# Patient Record
Sex: Male | Born: 1940
Health system: Southern US, Community
[De-identification: ages and names within clinical notes are randomized; demographics above are authoritative.]

## PROBLEM LIST (undated history)

## (undated) ENCOUNTER — Ambulatory Visit: Discharge status: Absent without leave | Payer: BLUE CROSS/BLUE SHIELD

## (undated) DIAGNOSIS — R011 Cardiac murmur, unspecified: Secondary | ICD-10-CM

## (undated) DIAGNOSIS — T7840XA Allergy, unspecified, initial encounter: Secondary | ICD-10-CM

## (undated) DIAGNOSIS — I4719 Other supraventricular tachycardia: Secondary | ICD-10-CM

## (undated) DIAGNOSIS — H269 Unspecified cataract: Secondary | ICD-10-CM

## (undated) DIAGNOSIS — I471 Supraventricular tachycardia: Secondary | ICD-10-CM

## (undated) DIAGNOSIS — C61 Malignant neoplasm of prostate: Secondary | ICD-10-CM

## (undated) DIAGNOSIS — E785 Hyperlipidemia, unspecified: Secondary | ICD-10-CM

## (undated) DIAGNOSIS — I099 Rheumatic heart disease, unspecified: Secondary | ICD-10-CM

## (undated) DIAGNOSIS — K5790 Diverticulosis of intestine, part unspecified, without perforation or abscess without bleeding: Secondary | ICD-10-CM

## (undated) DIAGNOSIS — Z5189 Encounter for other specified aftercare: Secondary | ICD-10-CM

## (undated) HISTORY — DX: Cardiac murmur, unspecified: R01.1

## (undated) HISTORY — DX: Diverticulosis of intestine, part unspecified, without perforation or abscess without bleeding: K57.90

## (undated) HISTORY — DX: Allergy, unspecified, initial encounter: T78.40XA

## (undated) HISTORY — DX: Malignant neoplasm of prostate: C61

## (undated) HISTORY — DX: Rheumatic heart disease, unspecified: I09.9

## (undated) HISTORY — DX: Hyperlipidemia, unspecified: E78.5

## (undated) HISTORY — DX: Other supraventricular tachycardia: I47.19

## (undated) HISTORY — DX: Supraventricular tachycardia: I47.1

## (undated) HISTORY — DX: Unspecified cataract: H26.9

## (undated) HISTORY — DX: Encounter for other specified aftercare: Z51.89

---
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
## 2004-04-29 HISTORY — PX: WRIST FUSION: SHX839

## 2004-09-26 ENCOUNTER — Ambulatory Visit: Payer: Self-pay | Admitting: Internal Medicine

## 2004-10-17 ENCOUNTER — Ambulatory Visit: Payer: Self-pay | Admitting: Internal Medicine

## 2006-01-27 HISTORY — PX: INGUINAL HERNIA REPAIR: SHX194

## 2006-02-24 ENCOUNTER — Encounter: Admission: RE | Admit: 2006-02-24 | Discharge: 2006-02-24 | Payer: Self-pay | Admitting: General Surgery

## 2006-03-12 ENCOUNTER — Encounter: Admission: RE | Admit: 2006-03-12 | Discharge: 2006-03-12 | Payer: Self-pay | Admitting: General Surgery

## 2007-04-30 HISTORY — PX: CATARACT EXTRACTION W/ INTRAOCULAR LENS IMPLANT: SHX1309

## 2008-08-15 ENCOUNTER — Ambulatory Visit: Payer: Self-pay | Admitting: Internal Medicine

## 2008-08-15 DIAGNOSIS — I019 Acute rheumatic heart disease, unspecified: Secondary | ICD-10-CM

## 2008-08-15 DIAGNOSIS — E785 Hyperlipidemia, unspecified: Secondary | ICD-10-CM

## 2008-08-15 DIAGNOSIS — I119 Hypertensive heart disease without heart failure: Secondary | ICD-10-CM

## 2008-08-29 ENCOUNTER — Encounter: Payer: Self-pay | Admitting: Internal Medicine

## 2008-08-30 ENCOUNTER — Ambulatory Visit: Payer: Self-pay

## 2008-09-02 ENCOUNTER — Ambulatory Visit: Payer: Self-pay

## 2008-09-02 ENCOUNTER — Ambulatory Visit: Payer: Self-pay | Admitting: Internal Medicine

## 2008-09-02 DIAGNOSIS — C61 Malignant neoplasm of prostate: Secondary | ICD-10-CM

## 2008-09-14 ENCOUNTER — Encounter: Payer: Self-pay | Admitting: Internal Medicine

## 2008-09-16 ENCOUNTER — Encounter: Payer: Self-pay | Admitting: Internal Medicine

## 2008-10-03 ENCOUNTER — Ambulatory Visit: Payer: Self-pay | Admitting: Internal Medicine

## 2008-10-03 LAB — CONVERTED CEMR LAB
Cholesterol, target level: 200 mg/dL
LDL Goal: 130 mg/dL

## 2008-10-24 ENCOUNTER — Telehealth: Payer: Self-pay | Admitting: Internal Medicine

## 2008-10-26 ENCOUNTER — Encounter: Payer: Self-pay | Admitting: Internal Medicine

## 2008-11-11 ENCOUNTER — Ambulatory Visit: Admission: RE | Admit: 2008-11-11 | Discharge: 2009-02-09 | Payer: Self-pay | Admitting: Radiation Oncology

## 2008-11-14 ENCOUNTER — Encounter: Payer: Self-pay | Admitting: Internal Medicine

## 2008-12-02 ENCOUNTER — Encounter: Admission: RE | Admit: 2008-12-02 | Discharge: 2008-12-02 | Payer: Self-pay | Admitting: Urology

## 2008-12-02 ENCOUNTER — Encounter: Payer: Self-pay | Admitting: Internal Medicine

## 2008-12-27 ENCOUNTER — Ambulatory Visit: Payer: Self-pay | Admitting: Internal Medicine

## 2009-01-06 ENCOUNTER — Telehealth (INDEPENDENT_AMBULATORY_CARE_PROVIDER_SITE_OTHER): Payer: Self-pay | Admitting: *Deleted

## 2009-01-13 ENCOUNTER — Ambulatory Visit (HOSPITAL_BASED_OUTPATIENT_CLINIC_OR_DEPARTMENT_OTHER): Admission: RE | Admit: 2009-01-13 | Discharge: 2009-01-13 | Payer: Self-pay | Admitting: Urology

## 2009-01-17 ENCOUNTER — Encounter: Payer: Self-pay | Admitting: Internal Medicine

## 2009-01-18 ENCOUNTER — Ambulatory Visit: Payer: Self-pay | Admitting: Internal Medicine

## 2009-01-24 ENCOUNTER — Encounter: Payer: Self-pay | Admitting: Internal Medicine

## 2009-01-24 ENCOUNTER — Ambulatory Visit: Payer: Self-pay | Admitting: Internal Medicine

## 2009-01-25 ENCOUNTER — Telehealth: Payer: Self-pay | Admitting: Internal Medicine

## 2009-01-25 ENCOUNTER — Telehealth (INDEPENDENT_AMBULATORY_CARE_PROVIDER_SITE_OTHER): Payer: Self-pay | Admitting: *Deleted

## 2009-02-01 ENCOUNTER — Ambulatory Visit: Payer: Self-pay | Admitting: Internal Medicine

## 2009-02-03 ENCOUNTER — Encounter: Payer: Self-pay | Admitting: Internal Medicine

## 2009-02-13 ENCOUNTER — Ambulatory Visit: Admission: RE | Admit: 2009-02-13 | Discharge: 2009-02-27 | Payer: Self-pay | Admitting: Radiation Oncology

## 2009-02-24 ENCOUNTER — Telehealth: Payer: Self-pay | Admitting: Internal Medicine

## 2009-02-27 ENCOUNTER — Ambulatory Visit: Payer: Self-pay | Admitting: Internal Medicine

## 2009-02-28 ENCOUNTER — Encounter: Payer: Self-pay | Admitting: Internal Medicine

## 2009-02-28 ENCOUNTER — Telehealth (INDEPENDENT_AMBULATORY_CARE_PROVIDER_SITE_OTHER): Payer: Self-pay | Admitting: *Deleted

## 2009-03-01 ENCOUNTER — Encounter (HOSPITAL_COMMUNITY): Admission: RE | Admit: 2009-03-01 | Discharge: 2009-04-25 | Payer: Self-pay | Admitting: Internal Medicine

## 2009-03-01 ENCOUNTER — Ambulatory Visit: Payer: Self-pay

## 2009-03-01 ENCOUNTER — Ambulatory Visit: Payer: Self-pay | Admitting: Cardiovascular Disease

## 2009-03-09 ENCOUNTER — Encounter: Payer: Self-pay | Admitting: Internal Medicine

## 2009-03-16 ENCOUNTER — Telehealth (INDEPENDENT_AMBULATORY_CARE_PROVIDER_SITE_OTHER): Payer: Self-pay | Admitting: *Deleted

## 2009-04-25 ENCOUNTER — Telehealth (INDEPENDENT_AMBULATORY_CARE_PROVIDER_SITE_OTHER): Payer: Self-pay | Admitting: *Deleted

## 2009-04-26 ENCOUNTER — Encounter: Payer: Self-pay | Admitting: Internal Medicine

## 2009-04-27 ENCOUNTER — Ambulatory Visit: Payer: Self-pay | Admitting: Internal Medicine

## 2009-04-29 HISTORY — PX: CARDIAC ELECTROPHYSIOLOGY STUDY AND ABLATION: SHX1294

## 2009-04-29 HISTORY — PX: TOTAL KNEE ARTHROPLASTY: SHX125

## 2009-04-29 HISTORY — PX: MELANOMA EXCISION: SHX5266

## 2009-05-01 ENCOUNTER — Ambulatory Visit: Payer: Self-pay | Admitting: Cardiovascular Disease

## 2009-05-01 ENCOUNTER — Inpatient Hospital Stay (HOSPITAL_COMMUNITY): Admission: AD | Admit: 2009-05-01 | Discharge: 2009-05-04 | Payer: Self-pay | Admitting: Orthopedic Surgery

## 2009-06-05 ENCOUNTER — Encounter: Payer: Self-pay | Admitting: Internal Medicine

## 2009-06-07 ENCOUNTER — Telehealth: Payer: Self-pay | Admitting: Internal Medicine

## 2009-06-13 ENCOUNTER — Telehealth (INDEPENDENT_AMBULATORY_CARE_PROVIDER_SITE_OTHER): Payer: Self-pay | Admitting: *Deleted

## 2009-07-19 ENCOUNTER — Encounter: Payer: Self-pay | Admitting: Internal Medicine

## 2009-07-27 ENCOUNTER — Ambulatory Visit: Payer: Self-pay | Admitting: Internal Medicine

## 2009-08-10 ENCOUNTER — Telehealth: Payer: Self-pay | Admitting: Internal Medicine

## 2009-08-23 ENCOUNTER — Ambulatory Visit: Payer: Self-pay | Admitting: Internal Medicine

## 2009-08-24 ENCOUNTER — Encounter: Payer: Self-pay | Admitting: Internal Medicine

## 2009-09-06 ENCOUNTER — Ambulatory Visit: Payer: Self-pay | Admitting: Internal Medicine

## 2009-09-06 LAB — CONVERTED CEMR LAB
BUN: 12 mg/dL (ref 6–23)
Basophils Absolute: 0 10*3/uL (ref 0.0–0.1)
Calcium: 9.6 mg/dL (ref 8.4–10.5)
Creatinine, Ser: 1 mg/dL (ref 0.4–1.5)
Eosinophils Relative: 1.7 % (ref 0.0–5.0)
GFR calc non Af Amer: 82.8 mL/min (ref >60)
Glucose, Bld: 78 mg/dL (ref 70–99)
HCT: 41.4 % (ref 39.0–52.0)
INR: 1 (ref 0.8–1.0)
Lymphocytes Relative: 36.8 % (ref 12.0–46.0)
Monocytes Relative: 14.4 % — ABNORMAL HIGH (ref 3.0–12.0)
Neutrophils Relative %: 46.4 % (ref 43.0–77.0)
Platelets: 209 10*3/uL (ref 150.0–400.0)
Potassium: 4.6 meq/L (ref 3.5–5.1)
Prothrombin Time: 10.6 s (ref 9.1–11.7)
RDW: 13.9 % (ref 11.5–14.6)
WBC: 4.8 10*3/uL (ref 4.5–10.5)
aPTT: 26.1 s (ref 21.7–28.8)

## 2009-09-13 ENCOUNTER — Ambulatory Visit (HOSPITAL_COMMUNITY): Admission: RE | Admit: 2009-09-13 | Discharge: 2009-09-14 | Payer: Self-pay | Admitting: Internal Medicine

## 2009-09-13 ENCOUNTER — Ambulatory Visit: Payer: Self-pay | Admitting: Internal Medicine

## 2009-10-11 ENCOUNTER — Ambulatory Visit: Payer: Self-pay | Admitting: Internal Medicine

## 2009-10-11 LAB — CONVERTED CEMR LAB
Cholesterol: 205 mg/dL — ABNORMAL HIGH (ref 0–200)
Direct LDL: 128.8 mg/dL
Nitrite: NEGATIVE
Specific Gravity, Urine: 1.025 (ref 1.000–1.030)
Total CHOL/HDL Ratio: 5
Total Protein, Urine: NEGATIVE mg/dL
Triglycerides: 149 mg/dL (ref 0.0–149.0)
Urine Glucose: NEGATIVE mg/dL
Urobilinogen, UA: 0.2 (ref 0.0–1.0)

## 2009-10-23 ENCOUNTER — Encounter: Payer: Self-pay | Admitting: Internal Medicine

## 2009-12-28 ENCOUNTER — Ambulatory Visit: Payer: Self-pay | Admitting: Internal Medicine

## 2010-01-09 ENCOUNTER — Telehealth: Payer: Self-pay | Admitting: Internal Medicine

## 2010-01-10 ENCOUNTER — Telehealth: Payer: Self-pay | Admitting: Internal Medicine

## 2010-02-05 ENCOUNTER — Ambulatory Visit: Payer: Self-pay | Admitting: Internal Medicine

## 2010-02-05 ENCOUNTER — Telehealth: Payer: Self-pay | Admitting: Internal Medicine

## 2010-02-05 DIAGNOSIS — L570 Actinic keratosis: Secondary | ICD-10-CM | POA: Insufficient documentation

## 2010-04-09 ENCOUNTER — Encounter: Payer: Self-pay | Admitting: Internal Medicine

## 2010-05-27 LAB — CONVERTED CEMR LAB
ALT: 23 units/L (ref 0–53)
Albumin: 4 g/dL (ref 3.5–5.2)
Alkaline Phosphatase: 70 units/L (ref 39–117)
Basophils Relative: 0.4 % (ref 0.0–3.0)
CO2: 29 meq/L (ref 19–32)
Chloride: 109 meq/L (ref 96–112)
Eosinophils Absolute: 0 10*3/uL (ref 0.0–0.7)
Eosinophils Relative: 0.9 % (ref 0.0–5.0)
Hemoglobin: 14.1 g/dL (ref 13.0–17.0)
Ketones, ur: NEGATIVE mg/dL
MCHC: 35.2 g/dL (ref 30.0–36.0)
MCV: 97.7 fL (ref 78.0–100.0)
Monocytes Absolute: 0.5 10*3/uL (ref 0.1–1.0)
Neutro Abs: 2.7 10*3/uL (ref 1.4–7.7)
Nitrite: NEGATIVE
PSA: 2.99 ng/mL (ref 0.10–4.00)
Potassium: 4.2 meq/L (ref 3.5–5.1)
RBC: 4.11 M/uL — ABNORMAL LOW (ref 4.22–5.81)
Sodium: 144 meq/L (ref 135–145)
Total CHOL/HDL Ratio: 5
Total Protein, Urine: NEGATIVE mg/dL
Total Protein: 7.2 g/dL (ref 6.0–8.3)
Triglycerides: 111 mg/dL (ref 0.0–149.0)

## 2010-05-29 NOTE — Letter (Signed)
[facility]   Imported By: Sherian Rein 10/26/2009 12:16:21  _____________________________________________________________________  External Attachment:    Type:   Image     Comment:   External Document

## 2010-05-29 NOTE — Assessment & Plan Note (Signed)
Summary: eph./ appt is 4:30/ gd   Visit Type:  Follow-up Referring Provider:  Dr Graciela Husbands Primary Provider:  Etta Grandchild MD   History of Present Illness: Patrick [name] is seen in followup for atrial arrhythmias. In late May he underwent catheter ablation in conjunction with Dr. Johney Frame who mapped and ablated a left peri-mitral annular tachycardia rendering him noninducible.  He went from having multiple episodes daily 2 having non-at all until yesterday when while playing golf he had 30 minutes of a heart rate in the 150 range. He has had none since. This is despite exertion which has been in the past in normal trigger.  Current Medications (verified): 1)  Aspirin Ec 325 Mg Tbec (Aspirin) .... Take One Tablet By Mouth Daily 2)  Multivitamins   Tabs (Multiple Vitamin) .Marland Kitchen.. 1 Tab By Mouth Once Daily 3)  Vitamin C 500 Mg Tabs (Ascorbic Acid) .Marland Kitchen.. 1 Tab By Mouth Once Daily 4)  Vitamin E 400 Unit Caps (Vitamin E) .Marland Kitchen.. 1 Tab By Mouth Once Daily 5)  Toprol Xl 50 Mg Xr24h-Tab (Metoprolol Succinate) .... Take One Tablet Daily 6)  Lotensin 10 Mg Tabs (Benazepril Hcl) .... As Needed 7)  Rapaflo 8 Mg Caps (Silodosin) .... As Needed  Allergies (verified): 1)  ! Penicillin V Potassium (Penicillin V Potassium)  Past History:  Past Medical History: Last updated: 08/23/2009 Hyperlipidemia Atrial tachycardia Prostate cancer Gleason 6 status post radiation seed implant September 2010 Rheumatic heart dz. at 7 yoa  Vital Signs:  Patient profile:   70 year old male Height:      75 inches Weight:      207 pounds Pulse rate:   70 / minute BP sitting:   122 / 78  (left arm)  Vitals Entered By: Laurance Flatten CMA (October 11, 2009 4:40 PM)  Physical Exam  General:  The patient was alert and oriented in no acute distress. HEENT Normal.  Neck veins were flat, carotids were brisk.  Lungs were clear.  Heart sounds were regular without murmurs or gallops.  Abdomen was soft with active bowel sounds. There is  no clubbing cyanosis or edema. Skin Warm and dry    EKG  Procedure date:  10/11/2009  Findings:      sinus rhythm at 70 Intervals 0.17/2106.38 Axis is -24 PVCs identified with a left bundle inferior axis  Impression & Recommendations:  Problem # 1:  PREMATURE VENTRICULAR CONTRACTIONS (ICD-427.69) PVCs are newly identified on electrocardiogram. They are asymptomatic. We will continue to observe them at this juncture without further evaluation His updated medication list for this problem includes:    Aspirin Ec 325 Mg Tbec (Aspirin) .Marland Kitchen... Take one tablet by mouth daily    Toprol Xl 50 Mg Xr24h-tab (Metoprolol succinate) .Marland Kitchen... Take one tablet daily    Lotensin 10 Mg Tabs (Benazepril hcl) .Marland Kitchen... As needed  Problem # 2:  LEFT ATRIAL TACHYCARDIA S/P RFCA (ICD-427.0) he is 99.9% better; obviously the event yesterday is disconcerting. We will continue to observe and let the frequency of recurrent episodes determine our course of action His updated medication list for this problem includes:    Aspirin Ec 325 Mg Tbec (Aspirin) .Marland Kitchen... Take one tablet by mouth daily    Toprol Xl 50 Mg Xr24h-tab (Metoprolol succinate) .Marland Kitchen... Take one tablet daily    Lotensin 10 Mg Tabs (Benazepril hcl) .Marland Kitchen... As needed  Problem # 3:  HYPERTENSION, HEART CONTROLLED W/O ASSOC CHF (ICD-402.10) his blood pressure has been well controlled on his current medications.  He has been taking his Lotensin only every few days. We will plan to have him stop it at this juncture and continue him on metoprolol.  Ultimately however beta blockers are not very good antihypertensives and some alternative therapy will be necessary if blood pressure remains  a necessary target of therapy His updated medication list for this problem includes:    Aspirin Ec 325 Mg Tbec (Aspirin) .Marland Kitchen... Take one tablet by mouth daily    Toprol Xl 50 Mg Xr24h-tab (Metoprolol succinate) .Marland Kitchen... Take one tablet daily    Lotensin 10 Mg Tabs (Benazepril hcl)  .Marland Kitchen... As needed  Patient Instructions: 1)  Your physician wants you to follow-up in:3 months with Dr Graciela Husbands.  You will receive a reminder letter in the mail two months in advance. If you don't receive a letter, please call our office to schedule the follow-up appointment.

## 2010-05-29 NOTE — Letter (Signed)
Summary: ELectrophysiology/Ablation Procedure Instructions  [facility], Main Office  [address]   [place], [place] [address]   Phone: [phone]  Fax: [phone]     Electrophysiology/Ablation Procedure Instructions    You are scheduled for a(n) ablation on 09/13/2009 at 7:30am with Dr. Johney Frame and Dr Graciela Husbands  1.  Please come to the Short Stay Center at [facility] at 5:30am on the day of your procedure.  2.  Come prepared to stay overnight.   Please bring your insurance cards and a list of your medications.  3.  Come to the [employer] office on 09/06/09 for lab work at [employer] do not have to be fasting.  4.  Do not have anything to eat or drink after midnight the night before your procedure.  5.  Do NOT take these medications for  the day prior and the day of your procedure unless otherwise instructed:  Toprol XL.  All of your remaining medications may be taken with a small amount of water.  6.  Educational material received:   _____ Ablation   * Occasionally, EP studies and ablations can become lengthy.  Please make your family aware of this before your procedure starts.  Average time ranges from 2-8 hours for EP studies/ablations.  Your physician will locate your family after the procedure with the results.  * If you have any questions after you get home, please call the office at [phone].  Anselm Pancoast

## 2010-05-29 NOTE — Letter (Signed)
Summary: Lipid Letter  [facility]  [address] [place], [place] [address]   Phone: [phone]  Fax: [phone]    10/11/2009  Quay Simkin [address] [place], [place]  [address]  Dear Peyton Najjar:  We have carefully reviewed your last lipid profile from 08/15/2008 and the results are noted below with a summary of recommendations for lipid management.    Cholesterol:       205     Goal: <200   HDL "good" Cholesterol:   57.84     Goal: >40   LDL "bad" Cholesterol:   129     Goal: <130   Triglycerides:       149.0     Goal: <150        TLC Diet (Therapeutic Lifestyle Change): Saturated Fats & Transfatty acids should be kept < 7% of total calories ***Reduce Saturated Fats Polyunstaurated Fat can be up to 10% of total calories Monounsaturated Fat Fat can be up to 20% of total calories Total Fat should be no greater than 25-35% of total calories Carbohydrates should be 50-60% of total calories Protein should be approximately 15% of total calories Fiber should be at least 20-30 grams a day ***Increased fiber may help lower LDL Total Cholesterol should be < 200mg /day Consider adding plant stanol/sterols to diet (example: Benacol spread) ***A higher intake of unsaturated fat may reduce Triglycerides and Increase HDL    Adjunctive Measures (may lower LIPIDS and reduce risk of Heart Attack) include: Aerobic Exercise (20-30 minutes 3-4 times a week) Limit Alcohol Consumption Weight Reduction Aspirin 75-81 mg a day by mouth (if not allergic or contraindicated) Dietary Fiber 20-30 grams a day by mouth     Current Medications: 1)    Aspirin Ec 325 Mg Tbec (Aspirin) .... Take one tablet by mouth daily 2)    Multivitamins   Tabs (Multiple vitamin) .Marland Kitchen.. 1 tab by mouth once daily 3)    Vitamin C 500 Mg Tabs (Ascorbic acid) .Marland Kitchen.. 1 tab by mouth once daily-hold 4)    Vitamin E 400 Unit Caps (Vitamin e) .Marland Kitchen.. 1 tab by mouth once daily-hold 5)    Toprol Xl 50 Mg Xr24h-tab  (Metoprolol succinate) .... Take one tablet daily 6)    Lotensin 10 Mg Tabs (Benazepril hcl) .... Take one tablet daily 7)    Rapaflo 8 Mg Caps (Silodosin) .... Take one capsule daily  If you have any questions, please call. We appreciate being able to work with you.   Sincerely,    [facility] Etta Grandchild MD

## 2010-05-29 NOTE — Letter (Signed)
Summary: Results Follow-up Letter  [facility]  [address] [place], [place] [address]   Phone: [phone]  Fax: [phone]    10/11/2009  [address] [place], [place]  [address]  Dear Patrick [name],   The following are the results of your recent test(s):  Test     Result     Prostate= 0.56   great Thyroid     normal Urine       normal   _________________________________________________________  Please call for an appointment as directed _________________________________________________________ _________________________________________________________ _________________________________________________________  Sincerely,  Sanda Linger MD [facility]

## 2010-05-29 NOTE — Progress Notes (Signed)
    Colorectal Screening:  Current Recommendations:    Colonoscopy recommended: patient defers today but will consider in the future  Colonoscopy Results:    Date of Exam: 10/17/2004    Results: Normal  PSA Screening:    PSA: 0.56  (10/11/2009)  Immunization & Chemoprophylaxis:    Tetanus vaccine: Td  (05/15/2006)    Influenza vaccine: Fluvax 3+  (02/05/2010)

## 2010-05-29 NOTE — Letter (Signed)
Summary: [facility]   Imported By: Lester Slabtown 07/26/2009 11:44:19  _____________________________________________________________________  External Attachment:    Type:   Image     Comment:   External Document

## 2010-05-29 NOTE — Letter (Signed)
Summary: [facility]   Imported By: Lester Quay 06/08/2009 09:44:41  _____________________________________________________________________  External Attachment:    Type:   Image     Comment:   External Document

## 2010-05-29 NOTE — Assessment & Plan Note (Signed)
Summary: 4 MO ROV /NWS   Vital Signs:  Patient profile:   70 year old male Height:      75 inches Weight:      212 pounds BMI:     26.59 O2 Sat:      96 % on Room air Temp:     97.1 degrees F oral Pulse rate:   76 / minute Pulse rhythm:   regular BP sitting:   118 / 70  (left arm) Cuff size:   large  Vitals Entered By: Rock Nephew CMA (February 05, 2010 8:28 AM)  O2 Flow:  Room air  Procedure Note Last Tetanus: Td (05/15/2006)  Cyst Removal: The patient complains of suspicious lesion. Date of onset: 12/12/2009 Onset of lesion: 2 months Indication: suspicious lesion    Size (in cm): 0.4 x 0.4    Location: left frontal scalp    Comment: the lesion is scaly and slightly raised with sharp edges, there is no pearly appearance and no pigmentation  Additional Instructions: cryotherapy was applied for 10 seconds X3, he tolerated it well  CC: follow-up visit Is Patient Diabetic? No Pain Assessment Patient in pain? no       Does patient need assistance? Functional Status Self care Ambulation Normal   Primary Care Provider:  Etta Grandchild MD  CC:  follow-up visit.  History of Present Illness:  Follow-Up Visit      This is a 70 year old man who presents for Follow-up visit.  The patient denies chest pain, palpitations, dizziness, syncope, edema, SOB, DOE, and PND.  Since the last visit the patient notes no new problems or concerns and being seen by a specialist.  The patient reports taking meds as prescribed, monitoring BP, and monitoring blood sugars.  When questioned about possible medication side effects, the patient notes none.    Preventive Screening-Counseling & Management  Alcohol-Tobacco     Alcohol drinks/day: <1     Smoking Status: never  Clinical Review Panels:  Prevention   Last Colonoscopy:  Normal (10/17/2004)   Last PSA:  0.56 (10/11/2009)  Immunizations   Last Tetanus Booster:  Td (05/15/2006)  Lipid Management   Cholesterol:  205  (10/11/2009)   LDL (bad choesterol):  125 (08/15/2008)   HDL (good cholesterol):  41.70 (10/11/2009)  Diabetes Management   Creatinine:  1.0 (09/06/2009)  CBC   WBC:  4.8 (09/06/2009)   RBC:  4.32 (09/06/2009)   Hgb:  14.4 (09/06/2009)   Hct:  41.4 (09/06/2009)   Platelets:  209.0 (09/06/2009)   MCV  95.9 (09/06/2009)   MCHC  34.8 (09/06/2009)   RDW  13.9 (09/06/2009)   PMN:  46.4 (09/06/2009)   Lymphs:  36.8 (09/06/2009)   Monos:  14.4 (09/06/2009)   Eosinophils:  1.7 (09/06/2009)   Basophil:  0.7 (09/06/2009)  Complete Metabolic Panel   Glucose:  78 (09/06/2009)   Sodium:  143 (09/06/2009)   Potassium:  4.6 (09/06/2009)   Chloride:  105 (09/06/2009)   CO2:  30 (09/06/2009)   BUN:  12 (09/06/2009)   Creatinine:  1.0 (09/06/2009)   Albumin:  4.0 (08/15/2008)   Total Protein:  7.2 (08/15/2008)   Calcium:  9.6 (09/06/2009)   Total Bili:  1.2 (08/15/2008)   Alk Phos:  70 (08/15/2008)   SGPT (ALT):  23 (08/15/2008)   SGOT (AST):  31 (08/15/2008)   Medications Prior to Update: 1)  Aspirin Ec 325 Mg Tbec (Aspirin) .... Take One Tablet By Mouth Daily 2)  Multivitamins  Tabs (Multiple Vitamin) .Marland Kitchen.. 1 Tab By Mouth Once Daily 3)  Vitamin C 500 Mg Tabs (Ascorbic Acid) .Marland Kitchen.. 1 Tab By Mouth Once Daily 4)  Vitamin E 400 Unit Caps (Vitamin E) .Marland Kitchen.. 1 Tab By Mouth Once Daily 5)  Metoprolol Succinate 25 Mg Xr24h-Tab (Metoprolol Succinate) .... Take One Tablet By Mouth Daily 6)  Rapaflo 8 Mg Caps (Silodosin) .... As Needed  Current Medications (verified): 1)  Aspirin Ec 325 Mg Tbec (Aspirin) .... Take One Tablet By Mouth Daily 2)  Multivitamins   Tabs (Multiple Vitamin) .Marland Kitchen.. 1 Tab By Mouth Once Daily 3)  Vitamin C 500 Mg Tabs (Ascorbic Acid) .Marland Kitchen.. 1 Tab By Mouth Once Daily 4)  Vitamin E 400 Unit Caps (Vitamin E) .Marland Kitchen.. 1 Tab By Mouth Once Daily 5)  Metoprolol Succinate 25 Mg Xr24h-Tab (Metoprolol Succinate) .... Take One Tablet By Mouth Daily  Allergies (verified): 1)  ! Penicillin V  Potassium (Penicillin V Potassium)  Past History:  Past Medical History: Last updated: 08/23/2009 Hyperlipidemia Atrial tachycardia Prostate cancer Gleason 6 status post radiation seed implant September 2010 Rheumatic heart dz. at 64 yoa  Past Surgical History: Last updated: 08/23/2009 Inguinal herniorrhaphy s/p TKA 1/11  Family History: Last updated: 08/15/2008 Family History of Arthritis Family History Breast cancer 1st degree relative <50 Family History Diabetes 1st degree relative  Social History: Last updated: 08/23/2009 Retired from [employer] and lives in [place], [place]. Married Never Smoked Alcohol use-no Drug use-no Regular exercise-yes  Risk Factors: Alcohol Use: <1 (02/05/2010) Exercise: yes (08/15/2008)  Risk Factors: Smoking Status: never (02/05/2010)  Family History: Reviewed history from 08/15/2008 and no changes required. Family History of Arthritis Family History Breast cancer 1st degree relative <50 Family History Diabetes 1st degree relative  Social History: Reviewed history from 08/23/2009 and no changes required. Retired from [employer] and lives in [place], [place]. Married Never Smoked Alcohol use-no Drug use-no Regular exercise-yes  Review of Systems  The patient denies anorexia, fever, weight loss, weight gain, chest pain, syncope, peripheral edema, prolonged cough, headaches, hemoptysis, abdominal pain, melena, hematochezia, severe indigestion/heartburn, hematuria, incontinence, difficulty walking, depression, enlarged lymph nodes, and angioedema.   GU:  Denies discharge, dysuria, erectile dysfunction, hematuria, nocturia, urinary frequency, and urinary hesitancy.  Physical Exam  General:  alert and well-developed.   Mouth:  no gingival abnormalities and pharynx pink and moist.   Neck:  No deformities, masses, or tenderness noted. Lungs:  Normal respiratory effort, chest expands symmetrically. Lungs are clear to  auscultation, no crackles or wheezes. Heart:  normal rate, regular rhythm, no murmur, no gallop, no rub, and no JVD.   Abdomen:  soft, non-tender, normal bowel sounds, no distention, no masses, no guarding, no rigidity, no rebound tenderness, no abdominal hernia, no hepatomegaly, and no splenomegaly.   Msk:  normal ROM, no joint tenderness, no joint swelling, no joint warmth, no redness over joints, no joint deformities, no joint instability, no crepitation, and no muscle atrophy.   Pulses:  R and L carotid,radial,femoral,dorsalis pedis and posterior tibial pulses are full and equal bilaterally Extremities:  no edema, no erythema  Neurologic:  alert & oriented X3, cranial nerves II-XII intact, and strength normal in all extremities.   Skin:  turgor normal, color normal, no rashes, no suspicious lesions, no ecchymoses, no petechiae, no purpura, no ulcerations, no edema, and actinic keratosis.   Cervical Nodes:  no anterior cervical adenopathy and no posterior cervical adenopathy.   Axillary Nodes:  no R axillary adenopathy and no L axillary  adenopathy.   Inguinal Nodes:  no R inguinal adenopathy and no L inguinal adenopathy.   Psych:  Cognition and judgment appear intact. Alert and cooperative with normal attention span and concentration. No apparent delusions, illusions, hallucinations   Impression & Recommendations:  Problem # 1:  ACTINIC KERATOSIS (ICD-702.0)  Orders: Cryotherapy/Destruction benign or premalignant lesion (1st lesion)  ([address])  Problem # 2:  LEFT ATRIAL TACHYCARDIA S/P RFCA (ICD-427.0) Assessment: Improved  His updated medication list for this problem includes:    Aspirin Ec 325 Mg Tbec (Aspirin) .Marland Kitchen... Take one tablet by mouth daily    Metoprolol Succinate 25 Mg Xr24h-tab (Metoprolol succinate) .Marland Kitchen... Take one tablet by mouth daily  Problem # 3:  ADENOCARCINOMA, PROSTATE, GLEASON GRADE 6 (ICD-185) Assessment: Improved  Problem # 4:  HYPERTENSION, HEART CONTROLLED W/O  ASSOC CHF (ICD-402.10) Assessment: Improved  His updated medication list for this problem includes:    Metoprolol Succinate 25 Mg Xr24h-tab (Metoprolol succinate) .Marland Kitchen... Take one tablet by mouth daily  Problem # 5:  HYPERLIPIDEMIA (ICD-272.4) Assessment: Unchanged he does not want to take a statin Labs Reviewed: SGOT: 31 (08/15/2008)   SGPT: 23 (08/15/2008)  Lipid Goals: Chol Goal: 200 (10/03/2008)   HDL Goal: 40 (10/03/2008)   LDL Goal: 130 (10/03/2008)   TG Goal: 150 (10/03/2008)  Prior 10 Yr Risk Heart Disease: 14 % (10/03/2008)   HDL:41.70 (10/11/2009), 38.30 (08/15/2008)  LDL:125 (08/15/2008)  Chol:205 (10/11/2009), 185 (08/15/2008)  Trig:149.0 (10/11/2009), 111.0 (08/15/2008)  Complete Medication List: 1)  Aspirin Ec 325 Mg Tbec (Aspirin) .... Take one tablet by mouth daily 2)  Multivitamins Tabs (Multiple vitamin) .Marland Kitchen.. 1 tab by mouth once daily 3)  Vitamin C 500 Mg Tabs (Ascorbic acid) .Marland Kitchen.. 1 tab by mouth once daily 4)  Vitamin E 400 Unit Caps (Vitamin e) .Marland Kitchen.. 1 tab by mouth once daily 5)  Metoprolol Succinate 25 Mg Xr24h-tab (Metoprolol succinate) .... Take one tablet by mouth daily  Patient Instructions: 1)  Please schedule a follow-up appointment in 4 months. 2)  It is important that you exercise regularly at least 20 minutes 5 times a week. If you develop chest pain, have severe difficulty breathing, or feel very tired , stop exercising immediately and seek medical attention. 3)  You need to lose weight. Consider a lower calorie diet and regular exercise.    Not Administered:    Pneumonia Vaccine not given due to: declined    Influenza Vaccine not given due to: declined    Appended Document: 4 MO ROV /NWS    Clinical Lists Changes  Orders: Added new Service order of Flu Vaccine 82yrs + MEDICARE PATIENTS ([id number]) - Signed Added new Service order of Administration Flu vaccine - MCR ([id number]) - Signed Observations: Added new observation of FLU VAX VIS: 11/21/2009  version (02/05/2010 9:52) Added new observation of FLU VAXLOT: [id number] (02/05/2010 9:52) Added new observation of FLU VAXMFR: [employer] (02/05/2010 9:52) Added new observation of FLU VAX EXP: 10/27/2010 (02/05/2010 9:52) Added new observation of FLU VAX DSE: 0.64ml (02/05/2010 9:52) Added new observation of FLU VAX: Fluvax 3+ (02/05/2010 9:52)Flu Vaccine Consent Questions     Do you have a history of severe allergic reactions to this vaccine? no    Any prior history of allergic reactions to egg and/or gelatin? no    Do you have a sensitivity to the preservative Thimersol? no    Do you have a past history of Guillan-Barre Syndrome? no    Do you currently have an acute febrile illness? no  Have you ever had a severe reaction to latex? no    Vaccine information given and explained to patient? yes    Are you currently pregnant? no    Lot Number:[id number]   Exp Date:10/27/2010   Site Given  Left Deltoid IMservation of FLU VAX DSE: 0.44ml (02/05/2010 9:52) Added new observation of FLU VAX: Fluvax 3+ (02/05/2010 9:52)     [email or web address] Patient decided to have vaccine/la

## 2010-05-29 NOTE — Progress Notes (Signed)
Summary: pt wants to talk about new med  Phone Note Call from Patient Call back at Home Phone [phone]   Caller: Patient Reason for Call: Talk to Nurse, Talk to Doctor Summary of Call: pt was given another med and he wants to talk to Dr. Graciela Husbands before he starts taking it the name is rapaflo 8mg  once daily  Initial call taken by: Omer Jack,  June 13, 2009 1:48 PM  Follow-up for Phone Call        talked with wife --pt given RX for Rapaflo last week by urologist  for urinary retention--was told one side effect could be rapid heart rate--pt states since he has a history of SVT, if this will be OK for him to take--will forward to Dr Graciela Husbands for review Luana Shu   Additional Follow-up for Phone Call Additional follow up Details #1::        discussed with dr Graciela Husbands, okay given for pt to take rapaflo. pt aware Deliah Goody, RN  June 13, 2009 6:24 PM

## 2010-05-29 NOTE — Progress Notes (Signed)
Summary: QUESTION ABOUT NEW MEDICATION  Phone Note Call from Patient Call back at Home Phone [phone]   Caller: Patient Summary of Call: PT CALLING REGARDING SOME NEW MEDICATION. Initial call taken by: Judie Grieve,  June 07, 2009 2:17 PM  Follow-up for Phone Call        The pt had colon CA and had seeds placed recently. Since then he has had issues with urinary retention. His doctor gave him a prescription for Rapaflo and one of the side effects (according to the pt) is tachycardia. He sees Dr. Graciela Husbands for SVT and wants to know if this drug is ok for him to take. I s/w Dr. Jens Som (DOD) and he said the side effects appear to be orthostatic hypotention and not SVT. So, the pt should be ok and to just ask the pt to stop it if he has any issues. Pt had already addressed this with the prescribing MD and he thinks this is the least of the evils. Pt aware and will call back after Tues, when Dr. Graciela Husbands is back in the office if he needs to.  Follow-up by: Duncan Dull, RN, BSN,  June 07, 2009 2:41 PM

## 2010-05-29 NOTE — Assessment & Plan Note (Signed)
Summary: 3 month rov.sl   Referring Provider:  Dr Graciela Husbands Primary Provider:  Etta Grandchild MD  CC:  3 month rov.  Pt states he is feeling great.  Patrick [name]  History of Present Illness: Mr Chaput is seen in followup for atrial arrhythmias. In late May he underwent catheter ablation in conjunction with Dr. Johney Frame who mapped and ablated a left peri-mitral annular tachycardia rendering him noninducible.  He went from having multiple episodes daily 2 having etially none. He feels terrific  Current Medications (verified): 1)  Aspirin Ec 325 Mg Tbec (Aspirin) .... Take One Tablet By Mouth Daily 2)  Multivitamins   Tabs (Multiple Vitamin) .Patrick [name].. 1 Tab By Mouth Once Daily 3)  Vitamin C 500 Mg Tabs (Ascorbic Acid) .Patrick [name].. 1 Tab By Mouth Once Daily 4)  Vitamin E 400 Unit Caps (Vitamin E) .Patrick [name].. 1 Tab By Mouth Once Daily 5)  Toprol Xl 50 Mg Xr24h-Tab (Metoprolol Succinate) .... Take One Tablet Daily 6)  Rapaflo 8 Mg Caps (Silodosin) .... As Needed  Allergies (verified): 1)  ! Penicillin V Potassium (Penicillin V Potassium)  Past History:  Past Medical History: Last updated: 08/23/2009 Hyperlipidemia Atrial tachycardia Prostate cancer Gleason 6 status post radiation seed implant September 2010 Rheumatic heart dz. at 75 yoa  Past Surgical History: Last updated: 08/23/2009 Inguinal herniorrhaphy s/p TKA 1/11  Family History: Last updated: 08/15/2008 Family History of Arthritis Family History Breast cancer 1st degree relative <50 Family History Diabetes 1st degree relative  Social History: Last updated: 08/23/2009 Retired from [employer] and lives in [place], [place]. Married Never Smoked Alcohol use-no Drug use-no Regular exercise-yes  Vital Signs:  Patient profile:   70 year old male Height:      75 inches Weight:      212 pounds BMI:     26.59 Pulse rate:   65 / minute Pulse rhythm:   irregular BP sitting:   138 / 80  (left arm) Cuff size:   regular  Vitals Entered By: Judithe Modest CMA (December 28, 2009 9:39 AM)  Physical Exam  General:  The patient was alert and oriented in no acute distress. HEENT Normal.  Neck veins were flat, carotids were brisk.  Lungs were clear.  Heart sounds were regular without murmurs or gallops.  Abdomen was soft with active bowel sounds. There is no clubbing cyanosis or edema. Skin Warm and dry.pex      Impression & Recommendations:  Problem # 1:  PREMATURE VENTRICULAR CONTRACTIONS (ICD-427.69)  stable  The following medications were removed from the medication list:    Lotensin 10 Mg Tabs (Benazepril hcl) .Patrick [name]... As needed His updated medication list for this problem includes:    Aspirin Ec 325 Mg Tbec (Aspirin) .Patrick [name]... Take one tablet by mouth daily    Toprol Xl 50 Mg Xr24h-tab (Metoprolol succinate) .Patrick [name]... Take one tablet daily  Problem # 2:  LEFT ATRIAL TACHYCARDIA S/P RFCA (ICD-427.0) no significant recurrences The following medications were removed from the medication list:    Lotensin 10 Mg Tabs (Benazepril hcl) .Patrick [name]... As needed His updated medication list for this problem includes:    Aspirin Ec 325 Mg Tbec (Aspirin) .Patrick [name]... Take one tablet by mouth daily    Toprol Xl 50 Mg Xr24h-tab (Metoprolol succinate) .Patrick [name]... Take one tablet daily  Other Orders: EKG w/ Interpretation (93000)  Patient Instructions: 1)  Your physician recommends that you schedule a follow-up appointment in: YEAR WITH DR Graciela Husbands 2)  Your physician has recommended you make the  following change in your medication: DECREASE TOPROL TO 25 MG once daily

## 2010-05-29 NOTE — Progress Notes (Signed)
Summary: wife request call  Phone Note Call from Patient Call back at Home Phone [phone]   Caller: Dorthula Rue Summary of Call: Pt wife request call Initial call taken by: Judie Grieve,  January 10, 2010 10:15 AM  Follow-up for Phone Call        will sent prescription for 25mg  metotoprol for 90 day. Claris Gladden, RN, BSN

## 2010-05-29 NOTE — Assessment & Plan Note (Signed)
Summary: yearly f/u , medicare/#/cd   Vital Signs:  Patient profile:   70 year old male Height:      75 inches Weight:      208 pounds BMI:     26.09 O2 Sat:      96 % on Room air Temp:     97.6 degrees F oral Pulse rate:   60 / minute Pulse rhythm:   regular Resp:     16 per minute BP sitting:   120 / 80  (left arm) Cuff size:   large  Vitals Entered By: Rock Nephew CMA (October 11, 2009 9:08 AM)  Nutrition Counseling: Patient's BMI is greater than 25 and therefore counseled on weight management options.  O2 Flow:  Room air CC: f/up Is Patient Diabetic? No Pain Assessment Patient in pain? no        Primary Care Provider:  Etta Grandchild MD  CC:  f/up.  History of Present Illness:  Follow-Up Visit      This is a 69 year old man who presents for Follow-up visit.  The patient denies chest pain, palpitations, dizziness, syncope, edema, SOB, DOE, PND, and orthopnea.  Since the last visit the patient notes no new problems or concerns and being seen by a specialist.  The patient reports taking meds as prescribed and monitoring BP.  When questioned about possible medication side effects, the patient notes none.    Preventive Screening-Counseling & Management  Alcohol-Tobacco     Alcohol drinks/day: <1     Smoking Status: never  Hep-HIV-STD-Contraception     Hepatitis Risk: no risk noted     HIV Risk: no risk noted     STD Risk: no risk noted  Clinical Review Panels:  Lipid Management   Cholesterol:  185 (08/15/2008)   LDL (bad choesterol):  125 (08/15/2008)   HDL (good cholesterol):  38.30 (08/15/2008)  Diabetes Management   Creatinine:  1.0 (09/06/2009)  CBC   WBC:  4.8 (09/06/2009)   RBC:  4.32 (09/06/2009)   Hgb:  14.4 (09/06/2009)   Hct:  41.4 (09/06/2009)   Platelets:  209.0 (09/06/2009)   MCV  95.9 (09/06/2009)   MCHC  34.8 (09/06/2009)   RDW  13.9 (09/06/2009)   PMN:  46.4 (09/06/2009)   Lymphs:  36.8 (09/06/2009)   Monos:  14.4 (09/06/2009)  Eosinophils:  1.7 (09/06/2009)   Basophil:  0.7 (09/06/2009)  Complete Metabolic Panel   Glucose:  78 (09/06/2009)   Sodium:  143 (09/06/2009)   Potassium:  4.6 (09/06/2009)   Chloride:  105 (09/06/2009)   CO2:  30 (09/06/2009)   BUN:  12 (09/06/2009)   Creatinine:  1.0 (09/06/2009)   Albumin:  4.0 (08/15/2008)   Total Protein:  7.2 (08/15/2008)   Calcium:  9.6 (09/06/2009)   Total Bili:  1.2 (08/15/2008)   Alk Phos:  70 (08/15/2008)   SGPT (ALT):  23 (08/15/2008)   SGOT (AST):  31 (08/15/2008)   Medications Prior to Update: 1)  Aspirin Ec 325 Mg Tbec (Aspirin) .... Take One Tablet By Mouth Daily 2)  Multivitamins   Tabs (Multiple Vitamin) .Marland Kitchen.. 1 Tab By Mouth Once Daily 3)  Vitamin C 500 Mg Tabs (Ascorbic Acid) .Marland Kitchen.. 1 Tab By Mouth Once Daily-Hold 4)  Vitamin E 400 Unit Caps (Vitamin E) .Marland Kitchen.. 1 Tab By Mouth Once Daily-Hold 5)  Toprol Xl 50 Mg Xr24h-Tab (Metoprolol Succinate) .... Take One Tablet Daily 6)  Lotensin 10 Mg Tabs (Benazepril Hcl) .... Take One Tablet Daily  7)  Rapaflo 8 Mg Caps (Silodosin) .... Take One Capsule Daily  Current Medications (verified): 1)  Aspirin Ec 325 Mg Tbec (Aspirin) .... Take One Tablet By Mouth Daily 2)  Multivitamins   Tabs (Multiple Vitamin) .Marland Kitchen.. 1 Tab By Mouth Once Daily 3)  Vitamin C 500 Mg Tabs (Ascorbic Acid) .Marland Kitchen.. 1 Tab By Mouth Once Daily-Hold 4)  Vitamin E 400 Unit Caps (Vitamin E) .Marland Kitchen.. 1 Tab By Mouth Once Daily-Hold 5)  Toprol Xl 50 Mg Xr24h-Tab (Metoprolol Succinate) .... Take One Tablet Daily 6)  Lotensin 10 Mg Tabs (Benazepril Hcl) .... Take One Tablet Daily 7)  Rapaflo 8 Mg Caps (Silodosin) .... Take One Capsule Daily  Allergies (verified): 1)  ! Penicillin V Potassium (Penicillin V Potassium)  Past History:  Past Medical History: Last updated: 08/23/2009 Hyperlipidemia Atrial tachycardia Prostate cancer Gleason 6 status post radiation seed implant September 2010 Rheumatic heart dz. at 59 yoa  Past Surgical History: Last  updated: 08/23/2009 Inguinal herniorrhaphy s/p TKA 1/11  Family History: Last updated: 08/15/2008 Family History of Arthritis Family History Breast cancer 1st degree relative <50 Family History Diabetes 1st degree relative  Social History: Last updated: 08/23/2009 Retired from [employer] and lives in [place], [place]. Married Never Smoked Alcohol use-no Drug use-no Regular exercise-yes  Risk Factors: Alcohol Use: <1 (10/11/2009) Exercise: yes (08/15/2008)  Risk Factors: Smoking Status: never (10/11/2009)  Family History: Reviewed history from 08/15/2008 and no changes required. Family History of Arthritis Family History Breast cancer 1st degree relative <50 Family History Diabetes 1st degree relative  Social History: Reviewed history from 08/23/2009 and no changes required. Retired from [employer] and lives in [place], [place]. Married Never Smoked Alcohol use-no Drug use-no Regular exercise-yes  Review of Systems  The patient denies chest pain, syncope, peripheral edema, prolonged cough, headaches, hemoptysis, abdominal pain, and difficulty walking.   GU:  Denies decreased libido, discharge, dysuria, erectile dysfunction, hematuria, nocturia, urinary frequency, and urinary hesitancy.  Physical Exam  General:  alert and well-developed.   Eyes:  vision grossly intact, pupils equal, and pupils round.   Mouth:  no gingival abnormalities and pharynx pink and moist.   Neck:  supple and no masses.   Lungs:  normal respiratory effort, no intercostal retractions, no accessory muscle use, normal breath sounds, no dullness, no fremitus, no crackles, and no wheezes.   Heart:  normal rate, regular rhythm, no murmur, no gallop, no rub, and no JVD.   Abdomen:  soft, non-tender, normal bowel sounds, no distention, no masses, no guarding, no rigidity, no rebound tenderness, no abdominal hernia, no hepatomegaly, and no splenomegaly.   Msk:  normal ROM, no joint  tenderness, no joint swelling, no joint warmth, no redness over joints, no joint deformities, no joint instability, no crepitation, and no muscle atrophy.   Pulses:  R and L carotid,radial,femoral,dorsalis pedis and posterior tibial pulses are full and equal bilaterally Extremities:  no edema, no erythema  Neurologic:  alert & oriented X3, cranial nerves II-XII intact, and strength normal in all extremities.   Skin:  Intact without suspicious lesions or rashes Cervical Nodes:  no anterior cervical adenopathy and no posterior cervical adenopathy.   Axillary Nodes:  no R axillary adenopathy and no L axillary adenopathy.   Inguinal Nodes:  no R inguinal adenopathy and no L inguinal adenopathy.   Psych:  Oriented X3, memory intact for recent and remote, good eye contact, not depressed appearing, not agitated, and slightly anxious.     Impression & Recommendations:  Problem # 1:  TACHYCARDIA (ICD-785.0) Assessment Improved  Problem # 2:  ADENOCARCINOMA, PROSTATE, GLEASON GRADE 6 (ICD-185) Assessment: Unchanged  Orders: Venipuncture (60454) TLB-Lipid Panel (80061-LIPID) TLB-TSH (Thyroid Stimulating Hormone) (84443-TSH) TLB-PSA (Prostate Specific Antigen) (84153-PSA) TLB-Udip w/ Micro (81001-URINE)  Problem # 3:  HYPERTENSION, HEART CONTROLLED W/O ASSOC CHF (ICD-402.10) Assessment: Improved  His updated medication list for this problem includes:    Toprol Xl 50 Mg Xr24h-tab (Metoprolol succinate) .Marland Kitchen... Take one tablet daily    Lotensin 10 Mg Tabs (Benazepril hcl) .Marland Kitchen... Take one tablet daily  BP today: 120/80 Prior BP: 120/84 (08/23/2009)  Prior 10 Yr Risk Heart Disease: 14 % (10/03/2008)  Labs Reviewed: K+: 4.6 (09/06/2009) Creat: : 1.0 (09/06/2009)   Chol: 185 (08/15/2008)   HDL: 38.30 (08/15/2008)   LDL: 125 (08/15/2008)   TG: 111.0 (08/15/2008)  Problem # 4:  HYPERLIPIDEMIA (ICD-272.4) Assessment: Unchanged  Orders: Venipuncture (09811) TLB-Lipid Panel  (80061-LIPID) TLB-TSH (Thyroid Stimulating Hormone) (84443-TSH) TLB-PSA (Prostate Specific Antigen) (84153-PSA) TLB-Udip w/ Micro (81001-URINE)  Labs Reviewed: SGOT: 31 (08/15/2008)   SGPT: 23 (08/15/2008)  Lipid Goals: Chol Goal: 200 (10/03/2008)   HDL Goal: 40 (10/03/2008)   LDL Goal: 130 (10/03/2008)   TG Goal: 150 (10/03/2008)  Prior 10 Yr Risk Heart Disease: 14 % (10/03/2008)   HDL:38.30 (08/15/2008)  LDL:125 (08/15/2008)  Chol:185 (08/15/2008)  Trig:111.0 (08/15/2008)  Complete Medication List: 1)  Aspirin Ec 325 Mg Tbec (Aspirin) .... Take one tablet by mouth daily 2)  Multivitamins Tabs (Multiple vitamin) .Marland Kitchen.. 1 tab by mouth once daily 3)  Vitamin C 500 Mg Tabs (Ascorbic acid) .Marland Kitchen.. 1 tab by mouth once daily-hold 4)  Vitamin E 400 Unit Caps (Vitamin e) .Marland Kitchen.. 1 tab by mouth once daily-hold 5)  Toprol Xl 50 Mg Xr24h-tab (Metoprolol succinate) .... Take one tablet daily 6)  Lotensin 10 Mg Tabs (Benazepril hcl) .... Take one tablet daily 7)  Rapaflo 8 Mg Caps (Silodosin) .... Take one capsule daily  Patient Instructions: 1)  Please schedule a follow-up appointment in 4 months. 2)  It is important that you exercise regularly at least 20 minutes 5 times a week. If you develop chest pain, have severe difficulty breathing, or feel very tired , stop exercising immediately and seek medical attention. 3)  You need to lose weight. Consider a lower calorie diet and regular exercise.

## 2010-05-29 NOTE — Progress Notes (Signed)
Summary: refill rquest  Phone Note Refill Request Message from:  Patient on January 09, 2010 8:29 AM  metroprolol 25 mg [facility] [address]   Method Requested: Telephone to Pharmacy Initial call taken by: Glynda Jaeger,  January 09, 2010 8:30 AM Caller: Patient Reason for Call: Talk to Nurse  Follow-up for Phone Call       Follow-up by: Judithe Modest CMA,  January 09, 2010 11:03 AM    Prescriptions: TOPROL XL 50 MG XR24H-TAB (METOPROLOL SUCCINATE) Take one tablet daily  #30 x 6   Entered by:   Judithe Modest CMA   Authorized by:   Nathen May, MD, [facility]   Signed by:   Judithe Modest CMA on 01/09/2010   Method used:   Electronically to        [id number] (retail)       [address]       [place], [place]  [address]       Ph: [phone] or [phone]       Fax: [phone]   RxID:   [id number]

## 2010-05-29 NOTE — Assessment & Plan Note (Signed)
Summary: per check out/sf   Primary Provider:  Etta Grandchild MD  CC:  pt states he is doing fine as long as he doesn't do anything.  He states that with activity his heart rate goes up very quickly.Marland Kitchen  History of Present Illness: Patrick [name] is seen in followup for tachycardia. This appears to be an atrial tachycardia possibly left-sided with negative P waves in lead L. and negative positive P waves in lead V1 he appeared isoelectric in lead one.  The patient undergone his knee surgery. He continues to be concerned about these palpitations which are in large part asymptomatic. He is not interested in further taking medications .  the metoprolol that we tried did not seem to help.  Current Medications (verified): 1)  Aspirin Ec 325 Mg Tbec (Aspirin) .... Take One Tablet By Mouth Daily-Hold 2)  Multivitamins   Tabs (Multiple Vitamin) .Marland Kitchen.. 1 Tab By Mouth Once Daily 3)  Vitamin C 500 Mg Tabs (Ascorbic Acid) .Marland Kitchen.. 1 Tab By Mouth Once Daily-Hold 4)  Vitamin E 400 Unit Caps (Vitamin E) .Marland Kitchen.. 1 Tab By Mouth Once Daily-Hold 5)  Toprol Xl 50 Mg Xr24h-Tab (Metoprolol Succinate) .... Take One Tablet Daily 6)  Lotensin 10 Mg Tabs (Benazepril Hcl) .... Take One Tablet Daily 7)  Rapaflo 8 Mg Caps (Silodosin) .... Take One Capsule Daily  Allergies (verified): 1)  ! Penicillin V Potassium (Penicillin V Potassium)  Past History:  Past Medical History: Last updated: 02/01/2009 Hyperlipidemia Prostate cancer Gleason 6 status post radiation seed implant September 2010 Rheumatic heart dz. at 63 yoa  Past Surgical History: Last updated: 08/15/2008 Inguinal herniorrhaphy  Family History: Last updated: 08/15/2008 Family History of Arthritis Family History Breast cancer 1st degree relative <50 Family History Diabetes 1st degree relative  Social History: Last updated: 08/15/2008 Retired Married Never Smoked Alcohol use-no Drug use-no Regular exercise-yes  Vital Signs:  Patient profile:   70 year  old male Height:      75 inches Weight:      207 pounds BMI:     25.97 Pulse rate:   74 / minute Pulse rhythm:   regular BP sitting:   140 / 82  (right arm) Cuff size:   regular  Vitals Entered By: Judithe Modest CMA (July 27, 2009 9:58 AM)  Physical Exam  General:  Well developed, well nourished, in no acute distress. Head:  normal HEENT Neck:  Neck supple,  Lungs:  clear to auscultationte Heart:  regular rate and rhythm at on initial examination quite rapid than normal and is quite rapidly in Abdomen:  Bowel sounds positive; abdomen soft and non-tender . No hepatosplenomegaly. Extremities:  no clubbing cyanosis or edema Neurologic:  Alert and oriented x 3.   EKG  Procedure date:  07/27/2009  Findings:      atrial tachycardia is evident described as previously. The cycle length is about 350 ms.  EKG  Procedure date:  07/27/2009  Findings:      sinus rhythm at 75  intervals 0.16/0.08/0.3 2  Impression & Recommendations:  Problem # 1:  SVT LONG RP (ICD-427.0) this is a long RP atrial tachycardia. The patient would desire to undergo catheter ablation. It may well be left-sided. I will review this with Dr. Johney Frame and plan a tag team procedure using Carto mapping;  we have discussed alternative benefits of intravitreal therapy as well as potential complications. He would like not to do that. We have also discussed procedure risks including but not limited to death  perforation stroke and vascular injury he understands and would like to proceed. His updated medication list for this problem includes:    Aspirin Ec 325 Mg Tbec (Aspirin) .Marland Kitchen... Take one tablet by mouth daily-hold    Toprol Xl 50 Mg Xr24h-tab (Metoprolol succinate) .Marland Kitchen... Take one tablet daily    Lotensin 10 Mg Tabs (Benazepril hcl) .Marland Kitchen... Take one tablet daily

## 2010-05-29 NOTE — Assessment & Plan Note (Signed)
Summary: svt ablation   Visit Type:  Follow-up Referring Provider:  Dr Graciela Husbands Primary Provider:  Etta Grandchild MD  CC:  atach.  History of Present Illness: Patrick [name] is a pleasant 70 yo WM who presents for further evaluation of atrial arrhythmias.  He has been evaluated by Dr Graciela Husbands and documented to have a long RP SVT, likely atrial tachycardia.  The patient reports having abrupt onset and gradual termination of "heart racing".   These episodes occasionally occur at rest, but sometimes occur with activity.  He reports palpitations and decreased exercise tolerance during episodes.  He has failed medical therapy with metoprolol.  The patient denies symptoms of chest pain, shortness of breath, orthopnea, PND, lower extremity edema, dizziness, presyncope, syncope, or neurologic sequela. The patient is tolerating medications without difficulties and is otherwise without complaint today.   Current Medications (verified): 1)  Aspirin Ec 325 Mg Tbec (Aspirin) .... Take One Tablet By Mouth Daily 2)  Multivitamins   Tabs (Multiple Vitamin) .Marland Kitchen.. 1 Tab By Mouth Once Daily 3)  Vitamin C 500 Mg Tabs (Ascorbic Acid) .Marland Kitchen.. 1 Tab By Mouth Once Daily-Hold 4)  Vitamin E 400 Unit Caps (Vitamin E) .Marland Kitchen.. 1 Tab By Mouth Once Daily-Hold 5)  Toprol Xl 50 Mg Xr24h-Tab (Metoprolol Succinate) .... Take One Tablet Daily 6)  Lotensin 10 Mg Tabs (Benazepril Hcl) .... Take One Tablet Daily 7)  Rapaflo 8 Mg Caps (Silodosin) .... Take One Capsule Daily  Allergies (verified): 1)  ! Penicillin V Potassium (Penicillin V Potassium)  Past History:  Past Medical History: Hyperlipidemia Atrial tachycardia Prostate cancer Gleason 6 status post radiation seed implant September 2010 Rheumatic heart dz. at 48 yoa  Past Surgical History: Inguinal herniorrhaphy s/p TKA 1/11  Family History: Reviewed history from 08/15/2008 and no changes required. Family History of Arthritis Family History Breast cancer 1st degree relative  <50 Family History Diabetes 1st degree relative  Social History: Retired from [employer] and lives in [place], [place]. Married Never Smoked Alcohol use-no Drug use-no Regular exercise-yes  Review of Systems       All systems are reviewed and negative except as listed in the HPI.   Vital Signs:  Patient profile:   70 year old male Height:      75 inches Weight:      207 pounds BMI:     25.97 Pulse rate:   71 / minute BP supine:   120 / 84  (left arm)  Vitals Entered By: Patrick [name] CMA (August 23, 2009 12:30 PM)  Physical Exam  General:  Well developed, well nourished, in no acute distress. Head:  normocephalic and atraumatic Eyes:  PERRLA/EOM intact; conjunctiva and lids normal. Mouth:  Teeth, gums and palate normal. Oral mucosa normal. Neck:  Neck supple, no JVD. No masses, thyromegaly or abnormal cervical nodes. Lungs:  Clear bilaterally to auscultation and percussion. Heart:  Non-displaced PMI, chest non-tender; regular rate and rhythm, S1, S2 without murmurs, rubs or gallops. Carotid upstroke normal, no bruit. Normal abdominal aortic size, no bruits. Femorals normal pulses, no bruits. Pedals normal pulses. No edema, no varicosities. Abdomen:  Bowel sounds positive; abdomen soft and non-tender without masses, organomegaly, or hernias noted. No hepatosplenomegaly. Msk:  Back normal, normal gait. Muscle strength and tone normal. Pulses:  pulses normal in all 4 extremities Extremities:  No clubbing or cyanosis. Neurologic:  Alert and oriented x 3. Skin:  Intact without lesions or rashes. Cervical Nodes:  no significant adenopathy Psych:  Normal affect.  EKG  Procedure date:  08/23/2009  Findings:      sinus rhythm 70 bpm, PR 170, otherwise normal ekg  Impression & Recommendations:  Problem # 1:  SVT LONG RP (ICD-427.0) The patienht has symptomatic intermittent long RP atrial tachycardia.   He has failed medical therapy with metoprolol.  Therapeutic  strategies for SVT including medicine and ablation were discussed in detail with the patient today. Risk, benefits, and alternatives to EP study and radiofrequency ablation  were also discussed in detail today. These risks include but are not limited to stroke, bleeding, vascular damage, tamponade, perforation, damage to the esophagus, lungs, and other structures,  AV block requiring pacemaker,  worsening renal function, and death. The patient understands these risk and wishes to proceed.  We will schedule EP study and ablation using Carto with Dr Graciela Husbands and myself at the next available time.  Problem # 2:  DIASTOLIC DYSFUNCTION (ICD-429.9) stable no changes  Problem # 3:  HYPERTENSION, HEART CONTROLLED W/O ASSOC CHF (ICD-402.10) stable  Problem # 4:  HYPERLIPIDEMIA (ICD-272.4) stable  Other Orders: EKG w/ Interpretation (93000)

## 2010-05-29 NOTE — Cardiovascular Report (Signed)
Summary: Hand Drawn Heart Pic   Hand Drawn Heart Pic   Imported By: Roderic Ovens 08/31/2009 13:32:30  _____________________________________________________________________  External Attachment:    Type:   Image     Comment:   External Document

## 2010-05-29 NOTE — Progress Notes (Signed)
Summary: referral to Dr Ladona Ridgel (discuss w/ SK 4/15)  Phone Note Call from Patient Call back at Home Phone [phone]   Caller: Patient Reason for Call: Talk to Nurse Summary of Call: calling regarding Dr Graciela Husbands speaking w/ Dr Ladona Ridgel about future plans, has not heard anything, request call back Initial call taken by: Migdalia Dk,  August 10, 2009 3:55 PM  Follow-up for Phone Call        I spoke with Amber about this and she is not quite sure the final plan has been decided. She will discuss with Dr. Graciela Husbands tomorrow and call the pt back. The pt has been made aware of this and he is agreeable. Follow-up by: Sherri Rad, RN, BSN,  August 10, 2009 4:22 PM  Additional Follow-up for Phone Call Additional follow up Details #1::        I spoke with Dr Johney Frame and he agreed that it was possibly left sided and he would be glad to see him in consulation Additional Follow-up by: Nathen May, MD, [facility],  August 11, 2009 1:43 PM     Appended Document: referral to Dr Ladona Ridgel (discuss w/ SK 4/15) Appt with Dr Johney Frame 08-23-09.  Pt aware and agrees with plan.

## 2010-05-31 NOTE — Letter (Signed)
Summary: [facility]   Imported By: Sherian Rein 04/16/2010 11:31:21  _____________________________________________________________________  External Attachment:    Type:   Image     Comment:   External Document

## 2010-07-15 LAB — CBC
HCT: 27.8 % — ABNORMAL LOW (ref 39.0–52.0)
HCT: 28 % — ABNORMAL LOW (ref 39.0–52.0)
HCT: 30.5 % — ABNORMAL LOW (ref 39.0–52.0)
Hemoglobin: 9.5 g/dL — ABNORMAL LOW (ref 13.0–17.0)
Hemoglobin: 9.7 g/dL — ABNORMAL LOW (ref 13.0–17.0)
MCHC: 33.9 g/dL (ref 30.0–36.0)
MCV: 97.6 fL (ref 78.0–100.0)
MCV: 98.3 fL (ref 78.0–100.0)
RBC: 2.87 MIL/uL — ABNORMAL LOW (ref 4.22–5.81)
RBC: 3.1 MIL/uL — ABNORMAL LOW (ref 4.22–5.81)
WBC: 7 10*3/uL (ref 4.0–10.5)
WBC: 7.1 10*3/uL (ref 4.0–10.5)
WBC: 8.2 10*3/uL (ref 4.0–10.5)

## 2010-07-15 LAB — BASIC METABOLIC PANEL
BUN: 8 mg/dL (ref 6–23)
CO2: 28 mEq/L (ref 19–32)
CO2: 30 mEq/L (ref 19–32)
CO2: 31 mEq/L (ref 19–32)
Chloride: 97 mEq/L (ref 96–112)
Chloride: 98 mEq/L (ref 96–112)
Chloride: 99 mEq/L (ref 96–112)
GFR calc Af Amer: >60 mL/min (ref >60)
GFR calc Af Amer: >60 mL/min (ref >60)
Glucose, Bld: 120 mg/dL — ABNORMAL HIGH (ref 70–99)
Potassium: 4.3 mEq/L (ref 3.5–5.1)
Potassium: 4.3 mEq/L (ref 3.5–5.1)
Potassium: 4.6 mEq/L (ref 3.5–5.1)
Sodium: 129 mEq/L — ABNORMAL LOW (ref 135–145)

## 2010-07-15 LAB — ABO/RH: ABO/RH(D): A POS

## 2010-07-15 LAB — TYPE AND SCREEN: ABO/RH(D): A POS

## 2010-07-15 LAB — PROTIME-INR: INR: 1.9 — ABNORMAL HIGH (ref 0.00–1.49)

## 2010-07-15 LAB — URIC ACID: Uric Acid, Serum: 4.6 mg/dL (ref 4.0–7.8)

## 2010-07-30 LAB — CBC
HCT: 39.3 % (ref 39.0–52.0)
Hemoglobin: 13.6 g/dL (ref 13.0–17.0)
Platelets: 212 10*3/uL (ref 150–400)
RBC: 4.07 MIL/uL — ABNORMAL LOW (ref 4.22–5.81)
WBC: 4.7 10*3/uL (ref 4.0–10.5)

## 2010-07-30 LAB — COMPREHENSIVE METABOLIC PANEL
ALT: 20 U/L (ref 0–53)
AST: 22 U/L (ref 0–37)
CO2: 30 mEq/L (ref 19–32)
Calcium: 9.3 mg/dL (ref 8.4–10.5)
Creatinine, Ser: 1 mg/dL (ref 0.4–1.5)
GFR calc non Af Amer: >60 mL/min (ref >60)
Glucose, Bld: 96 mg/dL (ref 70–99)
Potassium: 4 mEq/L (ref 3.5–5.1)
Total Protein: 6.9 g/dL (ref 6.0–8.3)

## 2010-07-30 LAB — URINALYSIS, ROUTINE W REFLEX MICROSCOPIC
Glucose, UA: NEGATIVE mg/dL
Protein, ur: NEGATIVE mg/dL
Specific Gravity, Urine: 1.029 (ref 1.005–1.030)
pH: 5.5 (ref 5.0–8.0)

## 2010-07-30 LAB — APTT: aPTT: 29 seconds (ref 24–37)

## 2010-07-30 LAB — PROTIME-INR: Prothrombin Time: 13.1 seconds (ref 11.6–15.2)

## 2010-08-03 LAB — COMPREHENSIVE METABOLIC PANEL
ALT: 19 U/L (ref 0–53)
AST: 26 U/L (ref 0–37)
Albumin: 3.9 g/dL (ref 3.5–5.2)
Alkaline Phosphatase: 69 U/L (ref 39–117)
CO2: 27 mEq/L (ref 19–32)
Chloride: 106 mEq/L (ref 96–112)
GFR calc Af Amer: >60 mL/min (ref >60)
GFR calc non Af Amer: >60 mL/min (ref >60)
Potassium: 4.2 mEq/L (ref 3.5–5.1)
Total Bilirubin: 0.9 mg/dL (ref 0.3–1.2)

## 2010-08-03 LAB — CBC
MCV: 99 fL (ref 78.0–100.0)
Platelets: 209 10*3/uL (ref 150–400)
RBC: 4 MIL/uL — ABNORMAL LOW (ref 4.22–5.81)
WBC: 5.6 10*3/uL (ref 4.0–10.5)

## 2010-09-11 NOTE — Letter (Signed)
February 27, 2009    Ollen Gross, MD  [facility]  [address]  [address]  [place], [place] [address]   RE:  [name], Patrick [name]  MRN:  [id number]  /  DOB:  07-13-1940   Dear Homero Fellers,   Patrick [name] is scheduled to undergo knee replacement surgery on January  3rd.  He has a history of a recently identified supraventricular  tachycardia, which has been largely asymptomatic.  Appears to be in  atrial tachycardia.  I think it should not be an issue related to his  surgery.  In the event that it were to occur, beta-blockers would likely  be sufficient to slow it down.  It is as noted asymptomatic and not  hemodynamically compromising.   If you have any further questions, please do not hesitate to contact me.    Sincerely,      Duke Salvia, MD, [facility]  Electronically Signed    SCK/MedQ  DD: 02/27/2009  DT: 02/28/2009  Job #: [id number]

## 2010-10-29 ENCOUNTER — Other Ambulatory Visit (INDEPENDENT_AMBULATORY_CARE_PROVIDER_SITE_OTHER): Payer: Medicare Other

## 2010-10-29 ENCOUNTER — Encounter: Payer: Self-pay | Admitting: Internal Medicine

## 2010-10-29 ENCOUNTER — Ambulatory Visit (INDEPENDENT_AMBULATORY_CARE_PROVIDER_SITE_OTHER): Payer: Medicare Other | Admitting: Internal Medicine

## 2010-10-29 VITALS — BP 122/80 | HR 71 | Temp 97.8°F | Wt 217.2 lb

## 2010-10-29 DIAGNOSIS — Z0000 Encounter for general adult medical examination without abnormal findings: Secondary | ICD-10-CM

## 2010-10-29 DIAGNOSIS — I119 Hypertensive heart disease without heart failure: Secondary | ICD-10-CM

## 2010-10-29 DIAGNOSIS — C61 Malignant neoplasm of prostate: Secondary | ICD-10-CM

## 2010-10-29 DIAGNOSIS — E785 Hyperlipidemia, unspecified: Secondary | ICD-10-CM

## 2010-10-29 DIAGNOSIS — M7750 Other enthesopathy of unspecified foot: Secondary | ICD-10-CM

## 2010-10-29 DIAGNOSIS — H6120 Impacted cerumen, unspecified ear: Secondary | ICD-10-CM

## 2010-10-29 DIAGNOSIS — M7741 Metatarsalgia, right foot: Secondary | ICD-10-CM | POA: Insufficient documentation

## 2010-10-29 DIAGNOSIS — I519 Heart disease, unspecified: Secondary | ICD-10-CM

## 2010-10-29 LAB — COMPREHENSIVE METABOLIC PANEL
ALT: 19 U/L (ref 0–53)
Albumin: 4.3 g/dL (ref 3.5–5.2)
Alkaline Phosphatase: 68 U/L (ref 39–117)
CO2: 30 mEq/L (ref 19–32)
GFR: 75.24 mL/min (ref >60.00)
Potassium: 4.2 mEq/L (ref 3.5–5.1)
Sodium: 140 mEq/L (ref 135–145)
Total Bilirubin: 1 mg/dL (ref 0.3–1.2)
Total Protein: 7.4 g/dL (ref 6.0–8.3)

## 2010-10-29 LAB — CBC WITH DIFFERENTIAL/PLATELET
Basophils Absolute: 0.1 10*3/uL (ref 0.0–0.1)
HCT: 41.5 % (ref 39.0–52.0)
Lymphocytes Relative: 32.7 % (ref 12.0–46.0)
Lymphs Abs: 1.8 10*3/uL (ref 0.7–4.0)
Monocytes Relative: 11.4 % (ref 3.0–12.0)
Neutrophils Relative %: 52 % (ref 43.0–77.0)
Platelets: 214 10*3/uL (ref 150.0–400.0)
RDW: 13.7 % (ref 11.5–14.6)
WBC: 5.6 10*3/uL (ref 4.5–10.5)

## 2010-10-29 LAB — LIPID PANEL
HDL: 47.2 mg/dL (ref >39.00)
LDL Cholesterol: 104 mg/dL — ABNORMAL HIGH (ref 0–99)
Total CHOL/HDL Ratio: 4
Triglycerides: 161 mg/dL — ABNORMAL HIGH (ref 0.0–149.0)
VLDL: 32.2 mg/dL (ref 0.0–40.0)

## 2010-10-29 NOTE — Progress Notes (Signed)
Subjective:    Patient ID: Patrick [name], male    DOB: 1941/04/04, 70 y.o.   MRN: [id number]  Hypertension This is a chronic problem. The current episode started more than 1 year ago. The problem has been gradually improving since onset. The problem is controlled. Pertinent negatives include no anxiety, blurred vision, chest pain, headaches, malaise/fatigue, neck pain, orthopnea, palpitations, peripheral edema, PND, shortness of breath or sweats. There are no associated agents to hypertension. Past treatments include beta blockers. The current treatment provides significant improvement. There are no compliance problems.   He wants the ear wax removed from both of his ears. He has has pain in his right foot (plantar side, over the 3rd MTP joint) for over one year and he wants to see a podiatrist.    Review of Systems  Constitutional: Negative for fever, chills, malaise/fatigue, diaphoresis, activity change, appetite change, fatigue and unexpected weight change.  HENT: Negative for hearing loss, ear pain, nosebleeds, congestion, sore throat, facial swelling, rhinorrhea, sneezing, mouth sores, trouble swallowing, neck pain, neck stiffness, voice change, postnasal drip, tinnitus and ear discharge.   Eyes: Negative for blurred vision, photophobia and visual disturbance.  Respiratory: Negative for apnea, cough, choking, chest tightness, shortness of breath, wheezing and stridor.   Cardiovascular: Negative for chest pain, palpitations, orthopnea, leg swelling and PND.  Gastrointestinal: Negative for nausea, vomiting, abdominal pain, diarrhea, constipation, blood in stool, abdominal distention, anal bleeding and rectal pain.  Genitourinary: Negative for dysuria, urgency, frequency, hematuria, flank pain, decreased urine volume, discharge, penile swelling, scrotal swelling, enuresis, difficulty urinating, genital sores, penile pain and testicular pain.  Musculoskeletal: Negative for myalgias, back pain,  joint swelling, arthralgias and gait problem.  Skin: Negative for color change, pallor and rash.  Neurological: Negative.  Negative for headaches.  Hematological: Negative for adenopathy. Does not bruise/bleed easily.  Psychiatric/Behavioral: Negative.        Objective:   Physical Exam  Vitals reviewed. Constitutional: He is oriented to person, place, and time. He appears well-developed and well-nourished. No distress.  HENT:  Head: Normocephalic and atraumatic.  Right Ear: Hearing, tympanic membrane, external ear and ear canal normal.  Left Ear: Hearing, tympanic membrane, external ear and ear canal normal.  Nose: Nose normal.  Mouth/Throat: Oropharynx is clear and moist. No oropharyngeal exudate.       He has cerumen in both ears  Eyes: Conjunctivae and EOM are normal. Pupils are equal, round, and reactive to light. Right eye exhibits no discharge. Left eye exhibits no discharge. No scleral icterus.  Neck: Normal range of motion. Neck supple. No JVD present. No tracheal deviation present. No thyromegaly present.  Cardiovascular: Normal rate, regular rhythm, normal heart sounds and intact distal pulses.  Exam reveals no gallop and no friction rub.   No murmur heard. Pulmonary/Chest: Effort normal and breath sounds normal. No stridor. No respiratory distress. He has no wheezes. He has no rales. He exhibits no tenderness.  Abdominal: Soft. Bowel sounds are normal. He exhibits no distension and no mass. There is no tenderness. There is no rebound and no guarding.  Genitourinary: Rectum normal, prostate normal and penis normal. Guaiac negative stool. No penile tenderness.  Musculoskeletal: Normal range of motion. He exhibits no edema and no tenderness.  Lymphadenopathy:    He has no cervical adenopathy.  Neurological: He is alert and oriented to person, place, and time. He has normal reflexes. He displays normal reflexes. No cranial nerve deficit. He exhibits normal muscle tone.  Coordination normal.  Skin:  Skin is warm and dry. No rash noted. He is not diaphoretic. No erythema. No pallor.  Psychiatric: He has a normal mood and affect. His behavior is normal. Judgment and thought content normal.          Assessment & Plan:

## 2010-10-29 NOTE — Assessment & Plan Note (Signed)
His BP is well controlled 

## 2010-10-29 NOTE — Assessment & Plan Note (Signed)
He has no s/s of fluid overload today

## 2010-10-29 NOTE — Assessment & Plan Note (Signed)
This has been well controlled since he had his seed implants 2 years ago

## 2010-10-29 NOTE — Patient Instructions (Signed)

## 2010-10-29 NOTE — Assessment & Plan Note (Signed)
Podiatry referral at his request

## 2010-10-29 NOTE — Assessment & Plan Note (Signed)
I put colace liq in both of his ears and then irrigated them, al the wax was removed and he tolerated the irrigation well, the post-irrigation exam is normal

## 2011-01-03 ENCOUNTER — Other Ambulatory Visit: Payer: Self-pay | Admitting: Internal Medicine

## 2011-02-20 ENCOUNTER — Ambulatory Visit (INDEPENDENT_AMBULATORY_CARE_PROVIDER_SITE_OTHER): Payer: Medicare Other | Admitting: Internal Medicine

## 2011-02-20 ENCOUNTER — Encounter: Payer: Self-pay | Admitting: Internal Medicine

## 2011-02-20 DIAGNOSIS — I119 Hypertensive heart disease without heart failure: Secondary | ICD-10-CM

## 2011-02-20 DIAGNOSIS — I498 Other specified cardiac arrhythmias: Secondary | ICD-10-CM

## 2011-02-20 DIAGNOSIS — I471 Supraventricular tachycardia: Secondary | ICD-10-CM | POA: Insufficient documentation

## 2011-02-20 NOTE — Assessment & Plan Note (Signed)
He had some palpitations following his procedure; however, there have been none in the last 6 months. We'll discontinue his low-dose metoprolol

## 2011-02-20 NOTE — Patient Instructions (Signed)
Your physician has recommended you make the following change in your medication:  1) Stop Metoprolol.  We will see you back on an as needed basis.

## 2011-02-20 NOTE — Progress Notes (Signed)
  HPI  Patrick [name] is a 70 y.o. male is seen in followup for atrial arrhythmias. In late May he underwent catheter ablation in conjunction with Dr. Johney Frame who mapped and ablated a left peri-mitral annular tachycardia rendering him noninducible.  He went from having multiple episodes daily 2 having etially none. He feels terrific    Past Medical History  Diagnosis Date  . Hyperlipemia   . Atrial tachycardia   . Prostate cancer     Gleason 6 status post radiation seed implant September 2010  . Rheumatic heart disease     70yrs old    Past Surgical History  Procedure Date  . Inguinal hernia repair   . Total knee arthroplasty 1/11    s/p    Current Outpatient Prescriptions  Medication Sig Dispense Refill  . aspirin 325 MG EC tablet Take 325 mg by mouth daily.        . metoprolol succinate (TOPROL-XL) 25 MG 24 hr tablet TAKE ONE TABLET BY MOUTH DAILY  90 tablet  3  . Multiple Vitamin (MULTIVITAMIN) tablet Take 1 tablet by mouth daily.        . vitamin C (ASCORBIC ACID) 500 MG tablet Take 500 mg by mouth daily.        . vitamin E 400 UNIT capsule Take 400 Units by mouth daily.          Allergies  Allergen Reactions  . Penicillins     Review of Systems negative except from HPI and PMH  Physical Exam Well developed and well nourished in no acute distress HENT normal E scleral and icterus clear Neck Supple JVP flat; carotids brisk and full Clear to ausculation Regular rate and rhythm, no murmurs gallops or rub Soft with active bowel sounds No clubbing cyanosis and edema Alert and oriented, grossly normal motor and sensory function Skin Warm and Dry  ECG sinus rhythm at 68 Intervals 0.18/09/0.39 otherwise normal Assessment and  Plan

## 2011-02-20 NOTE — Assessment & Plan Note (Signed)
Blood pressure is reasonable. We'll have him stop his metoprolol  and followup with his blood pressures. He may need low-dose therapy

## 2011-03-28 NOTE — Progress Notes (Signed)
Addended by: Debbe Bales on: 03/28/2011 02:44 PM   Modules accepted: Orders

## 2011-05-06 ENCOUNTER — Ambulatory Visit (INDEPENDENT_AMBULATORY_CARE_PROVIDER_SITE_OTHER): Payer: Medicare Other | Admitting: Internal Medicine

## 2011-05-06 ENCOUNTER — Encounter: Payer: Self-pay | Admitting: Internal Medicine

## 2011-05-06 DIAGNOSIS — R221 Localized swelling, mass and lump, neck: Secondary | ICD-10-CM

## 2011-05-06 DIAGNOSIS — I119 Hypertensive heart disease without heart failure: Secondary | ICD-10-CM | POA: Diagnosis not present

## 2011-05-06 DIAGNOSIS — I498 Other specified cardiac arrhythmias: Secondary | ICD-10-CM | POA: Diagnosis not present

## 2011-05-06 DIAGNOSIS — R220 Localized swelling, mass and lump, head: Secondary | ICD-10-CM

## 2011-05-06 DIAGNOSIS — I471 Supraventricular tachycardia: Secondary | ICD-10-CM

## 2011-05-06 NOTE — Assessment & Plan Note (Signed)
He is doing well off of beta-blocker therapy

## 2011-05-06 NOTE — Progress Notes (Addendum)
Subjective:    Patient ID: Patrick [name], male    DOB: May 05, 1940, 71 y.o.   MRN: [id number]  Hypertension This is a chronic problem. The current episode started more than 1 year ago. The problem has been gradually improving since onset. The problem is controlled. Pertinent negatives include no anxiety, blurred vision, chest pain, headaches, malaise/fatigue, neck pain, orthopnea, palpitations, peripheral edema, PND, shortness of breath or sweats. There are no associated agents to hypertension. Past treatments include nothing.  Also, he complains of left facial swelling for 6 months, he saw his dentist and she gave him clindamycin but that did not help. He tells me that it is not painful.    Review of Systems  Constitutional: Negative.  Negative for malaise/fatigue.  HENT: Positive for facial swelling, rhinorrhea and postnasal drip. Negative for hearing loss, nosebleeds, congestion, sore throat, sneezing, drooling, mouth sores, trouble swallowing, neck pain, neck stiffness, dental problem, sinus pressure, tinnitus and ear discharge.   Eyes: Negative.  Negative for blurred vision.  Respiratory: Negative.  Negative for shortness of breath.   Cardiovascular: Negative.  Negative for chest pain, palpitations, orthopnea and PND.  Gastrointestinal: Negative.   Genitourinary: Negative.   Musculoskeletal: Negative.   Skin: Negative.   Neurological: Negative.  Negative for headaches.  Hematological: Negative.   Psychiatric/Behavioral: Negative.        Objective:   Physical Exam  Vitals reviewed. Constitutional: He is oriented to person, place, and time. He appears well-developed and well-nourished. No distress.  HENT:  Head: Normocephalic and atraumatic. Head is without raccoon's eyes, without Battle's sign, without contusion, without right periorbital erythema and without left periorbital erythema.    Right Ear: Hearing, tympanic membrane, external ear and ear canal normal.  Left Ear:  Hearing, tympanic membrane, external ear and ear canal normal.  Nose: Nose normal. No mucosal edema, rhinorrhea, nose lacerations, sinus tenderness, nasal deformity, septal deviation or nasal septal hematoma. No epistaxis.  No foreign bodies. Right sinus exhibits no maxillary sinus tenderness and no frontal sinus tenderness. Left sinus exhibits no maxillary sinus tenderness and no frontal sinus tenderness.  Mouth/Throat: Oropharynx is clear and moist and mucous membranes are normal. Mucous membranes are not pale, not dry and not cyanotic. No oropharyngeal exudate, posterior oropharyngeal edema, posterior oropharyngeal erythema or tonsillar abscesses.  Eyes: Conjunctivae are normal. Right eye exhibits no discharge. Left eye exhibits no discharge. No scleral icterus.  Neck: Normal range of motion. Neck supple. No JVD present. No tracheal deviation present. No thyromegaly present.  Cardiovascular: Normal rate, regular rhythm, normal heart sounds and intact distal pulses.  Exam reveals no gallop and no friction rub.   No murmur heard. Pulmonary/Chest: Effort normal and breath sounds normal. No stridor. No respiratory distress. He has no wheezes. He has no rales. He exhibits no tenderness.  Abdominal: Soft. Bowel sounds are normal. He exhibits no distension and no mass. There is no tenderness. There is no rebound and no guarding.  Musculoskeletal: Normal range of motion. He exhibits no edema and no tenderness.  Lymphadenopathy:    He has no cervical adenopathy.  Neurological: He is oriented to person, place, and time.  Skin: Skin is warm and dry. No rash noted. He is not diaphoretic. No erythema. No pallor.  Psychiatric: He has a normal mood and affect. His behavior is normal. Judgment and thought content normal.      Lab Results  Component Value Date   WBC 5.6 10/29/2010   HGB 14.4 10/29/2010   HCT 41.5  10/29/2010   PLT 214.0 10/29/2010   GLUCOSE 97 10/29/2010   CHOL 183 10/29/2010   TRIG 161.0* 10/29/2010     HDL 47.20 10/29/2010   LDLDIRECT 128.8 10/11/2009   LDLCALC 104* 10/29/2010   ALT 19 10/29/2010   AST 23 10/29/2010   NA 140 10/29/2010   K 4.2 10/29/2010   CL 106 10/29/2010   CREATININE 1.0 10/29/2010   BUN 11 10/29/2010   CO2 30 10/29/2010   TSH 3.05 10/29/2010   PSA 0.56 10/11/2009   INR 1.0 ratio 09/06/2009     Assessment & Plan:

## 2011-05-06 NOTE — Assessment & Plan Note (Signed)
His BP is well controlled 

## 2011-05-06 NOTE — Progress Notes (Signed)
Addended by: Etta Grandchild on: 05/06/2011 03:25 PM   Modules accepted: Orders, Level of Service

## 2011-05-06 NOTE — Patient Instructions (Signed)

## 2011-05-06 NOTE — Assessment & Plan Note (Signed)
I have asked him to do a CT scan of the area to look for mass, lesion, obstruction, etc

## 2011-05-07 ENCOUNTER — Telehealth: Payer: Self-pay

## 2011-05-07 DIAGNOSIS — I119 Hypertensive heart disease without heart failure: Secondary | ICD-10-CM

## 2011-05-07 NOTE — Telephone Encounter (Signed)
CT called stating that pt will need BUN and Creatine prior to is scan 05/10/11. Orders placed in Epic, pt already notified

## 2011-05-10 ENCOUNTER — Other Ambulatory Visit: Payer: Medicare Other

## 2011-05-13 ENCOUNTER — Ambulatory Visit: Payer: Medicare Other

## 2011-05-13 DIAGNOSIS — I119 Hypertensive heart disease without heart failure: Secondary | ICD-10-CM

## 2011-05-14 ENCOUNTER — Ambulatory Visit: Payer: Medicare Other | Admitting: Internal Medicine

## 2011-05-14 ENCOUNTER — Ambulatory Visit (INDEPENDENT_AMBULATORY_CARE_PROVIDER_SITE_OTHER): Payer: Medicare Other | Admitting: Internal Medicine

## 2011-05-14 ENCOUNTER — Encounter: Payer: Self-pay | Admitting: Internal Medicine

## 2011-05-14 VITALS — BP 142/84 | HR 69 | Temp 97.6°F | Resp 16

## 2011-05-14 DIAGNOSIS — T783XXA Angioneurotic edema, initial encounter: Secondary | ICD-10-CM | POA: Diagnosis not present

## 2011-05-14 MED ORDER — CETIRIZINE HCL 10 MG PO TABS: 10.0000 mg | ORAL_TABLET | Freq: Every day | ORAL | Status: DC

## 2011-05-14 NOTE — Assessment & Plan Note (Signed)
The lesion today is obviously angioedema and the pt tells me that the timing fits with him restarting aspirin therapy, he will control the swelling with zyrtec and will stop the asa, it is now listed as a severe allergy

## 2011-05-14 NOTE — Progress Notes (Signed)
  Subjective:    Patient ID: Patrick [name], male    DOB: 1940-12-07, 71 y.o.   MRN: [id number]  Allergic Reaction This is a recurrent problem. The current episode started 2 days ago. The problem occurs intermittently. The problem has been gradually worsening since onset. The problem is mild. The patient was exposed to an OTC medication (he started aspirin again 4 days ago). The time of exposure was just prior to onset. Pertinent negatives include no abdominal pain, chest pain, chest pressure, coughing, diarrhea, difficulty breathing, drooling, eye itching, eye redness, eye watering, globus sensation, hyperventilation, itching, rash, stridor, trouble swallowing, vomiting or wheezing. Swelling location: under his left eye. Past treatments include nothing.      Review of Systems  Constitutional: Negative.   HENT: Negative.  Negative for drooling and trouble swallowing.   Eyes: Negative for photophobia, pain, discharge, redness, itching and visual disturbance.  Respiratory: Negative for apnea, cough, choking, chest tightness, shortness of breath, wheezing and stridor.   Cardiovascular: Negative for chest pain, palpitations and leg swelling.  Gastrointestinal: Negative for nausea, vomiting, abdominal pain, diarrhea, blood in stool and abdominal distention.  Genitourinary: Negative.   Musculoskeletal: Negative.   Skin: Positive for color change. Negative for itching, pallor, rash and wound.  Neurological: Negative.   Hematological: Negative for adenopathy. Does not bruise/bleed easily.  Psychiatric/Behavioral: Negative.        Objective:   Physical Exam  Vitals reviewed. Constitutional: He is oriented to person, place, and time. He appears well-developed and well-nourished. No distress.  HENT:  Head: Normocephalic and atraumatic. Head is without raccoon's eyes and without Battle's sign. No trismus in the jaw.    Right Ear: Hearing, tympanic membrane, external ear and ear canal normal.  Left  Ear: Hearing, tympanic membrane, external ear and ear canal normal.  Nose: Nose normal. No mucosal edema, rhinorrhea or sinus tenderness.  Mouth/Throat: Uvula is midline, oropharynx is clear and moist and mucous membranes are normal. Mucous membranes are not pale, not dry and not cyanotic. No uvula swelling. No oropharyngeal exudate, posterior oropharyngeal edema, posterior oropharyngeal erythema or tonsillar abscesses.  Eyes: Conjunctivae and EOM are normal. Pupils are equal, round, and reactive to light. Right eye exhibits no discharge. Left eye exhibits no discharge. No scleral icterus.  Neck: Normal range of motion. Neck supple. No JVD present. No tracheal deviation present. No thyromegaly present.  Cardiovascular: Normal rate, regular rhythm, normal heart sounds and intact distal pulses.  Exam reveals no gallop and no friction rub.   No murmur heard. Pulmonary/Chest: Effort normal and breath sounds normal. No stridor. No respiratory distress. He has no wheezes. He has no rales. He exhibits no tenderness.  Abdominal: Soft. Bowel sounds are normal. He exhibits no distension and no mass. There is no tenderness. There is no rebound and no guarding.  Musculoskeletal: Normal range of motion. He exhibits no edema and no tenderness.  Lymphadenopathy:    He has no cervical adenopathy.  Neurological: He is oriented to person, place, and time.  Skin: Skin is warm and dry. No rash noted. He is not diaphoretic. No erythema. No pallor.  Psychiatric: He has a normal mood and affect. His behavior is normal. Judgment and thought content normal.          Assessment & Plan:

## 2011-05-14 NOTE — Patient Instructions (Signed)
Angioedema Angioedema (AE) is a sudden swelling of the eyelids, lips, lobes of ears, external genitalia, skin, and other parts of the body. AE can happen by itself. It usually begins during the night and is found on awakening. It can happen with hives and other allergic reactions. Attacks can be mild and annoying, or life-threatening if the air passages swell. AE generally occurs in a short time period (over minutes to hours) and gets better in 24 to 48 hours. It usually does not cause any serious problems.  There are 2 different kinds of AE:   Allergic AE.   Nonallergic AE.   There may be an overreaction or direct stimulation of cells that are a part of the immune system (mast cells).   There may be problems with the release of chemicals made by the body that cause swelling and inflammation (kinins). AE due to kinins can be inherited from parents (hereditary), or it can develop on its own (acquired). Acquired AE either shows up before, or along with, certain diseases or is due to the body's immune system attacking parts of the body's own cells (autoimmune).  CAUSES  Allergic  AE due to allergic reactions are caused by something that causes the body to react (trigger). Common triggers include:   Foods.   Medicines.   Latex.   Direct contact with certain fruits, vegetables, or animal saliva.   Insect stings.  Nonallergic  Mast cell stimulation may be caused by:   Medicines.   Dyes used in X-rays.   The body's own immune system reactions to parts of the body (autoimmune disease).   Possibly, some virus infections.   AE due to problems with kinins can be hereditary or acquired. Attacks are triggered by:   Mild injury.   Dental work or any surgery.   Stress.   Sudden changes in temperature.   Exercise.   Medicines.   AE due to problems with kinins can also be due to certain medicines, especially blood pressure medicines like angiotensin-converting enzyme (ACE)  inhibitors. African Americans are at nearly 5 times greater risk of developing AE than Caucasians from ACE inhibitors.  SYMPTOMS  Allergic symptoms:  Non-itchy swelling of the skin. Often the swelling is on the face and lips, but any area of the skin can swell. Sometimes, the swelling can be painful. If hives are present, there is intense itching.   Breathing problems if the air passages swell.  Nonallergic symptoms:  If internal organs are involved, there may be:   Nausea.   Abdominal pain.   Vomiting.   Difficulty swallowing.   Difficulty passing urine.   Breathing problems if the air passages swell.  Depending on the cause of AE, episodes may:  Only happen once (if triggers are removed or avoided).   Come back in unpredictable patterns.   Repeat for several years and then gradually fade away.  DIAGNOSIS  AE is diagnosed by:   Asking questions to find out how fast the symptoms began.   Taking a family history.   Physical exam.   Diagnostic tests. Tests could include:   Allergy skin tests to see if the problem is allergic.   Blood tests to diagnose hereditary and some acquired types of AE.   Other tests to see if there is a hidden disease leading to the AE.  TREATMENT  Treatment depends on the type and cause (if any) of the AE. Allergic  Allergic types of AE are treated with:   Immediate removal of   the trigger or medicine (if any).   Epinephrine injection.   Steroids.   Antihistamines.   Hospitalization for severe attacks.  Nonallergic  Mast cell stimulation types of AE are treated with:   Immediate removal of the trigger or medicine (if any).   Epinephrine injection.   Steroids.   Antihistamines.   Hospitalization for severe attacks.   Hereditary AE is treated with:   Medicines to prevent and treat attacks. There is little response to antihistamines, epinephrine, or steroids.   Preventive medicines before dental work or surgery.    Removing or avoiding medicines that trigger attacks.   Hospitalization for severe attacks.   Acquired AE is treated with:   Treating underlying disease (if any).   Medicines to prevent and treat attacks.  HOME CARE INSTRUCTIONS   Always carry your emergency allergy treatment medicines with you.   Wear a medical bracelet.   Avoid known triggers.  SEEK MEDICAL CARE IF:   You get repeat attacks.   Your attacks are more frequent or more severe despite preventive measures.   You have hereditary AE and are considering having children. It is important to discuss the risks of passing this on to your children.  SEEK IMMEDIATE MEDICAL CARE IF:   You have difficulty breathing.   You have difficulty swallowing.   You experience fainting.  This condition should be treated immediately. It can be life-threatening if it involves throat swelling. Document Released: 06/24/2001 Document Revised: 12/26/2010 Document Reviewed: 04/14/2008 [facility] [employer] Patient Information 2012 [place], [place].

## 2011-05-15 ENCOUNTER — Ambulatory Visit (INDEPENDENT_AMBULATORY_CARE_PROVIDER_SITE_OTHER)
Admission: RE | Admit: 2011-05-15 | Discharge: 2011-05-15 | Disposition: A | Discharge status: Home / Self Care | Payer: Medicare Other | Source: Ambulatory Visit | Attending: Internal Medicine | Admitting: Internal Medicine

## 2011-05-15 DIAGNOSIS — R220 Localized swelling, mass and lump, head: Secondary | ICD-10-CM | POA: Diagnosis not present

## 2011-05-15 DIAGNOSIS — R221 Localized swelling, mass and lump, neck: Secondary | ICD-10-CM | POA: Diagnosis not present

## 2011-05-15 MED ORDER — IOHEXOL 300 MG/ML  SOLN
80.0000 mL | Freq: Once | INTRAMUSCULAR | Status: AC | PRN
  Administered 2011-05-15: 80 mL via INTRAVENOUS

## 2011-06-03 DIAGNOSIS — M1710 Unilateral primary osteoarthritis, unspecified knee: Secondary | ICD-10-CM | POA: Diagnosis not present

## 2011-06-14 DIAGNOSIS — M19049 Primary osteoarthritis, unspecified hand: Secondary | ICD-10-CM | POA: Diagnosis not present

## 2011-06-24 DIAGNOSIS — Z8546 Personal history of malignant neoplasm of prostate: Secondary | ICD-10-CM | POA: Diagnosis not present

## 2011-07-15 DIAGNOSIS — Z8546 Personal history of malignant neoplasm of prostate: Secondary | ICD-10-CM | POA: Diagnosis not present

## 2011-07-15 DIAGNOSIS — N529 Male erectile dysfunction, unspecified: Secondary | ICD-10-CM | POA: Diagnosis not present

## 2011-07-15 DIAGNOSIS — R351 Nocturia: Secondary | ICD-10-CM | POA: Diagnosis not present

## 2011-08-05 DIAGNOSIS — M7710 Lateral epicondylitis, unspecified elbow: Secondary | ICD-10-CM | POA: Diagnosis not present

## 2011-08-23 ENCOUNTER — Encounter: Payer: Self-pay | Admitting: Internal Medicine

## 2011-09-18 ENCOUNTER — Encounter: Payer: Self-pay | Admitting: Internal Medicine

## 2011-09-18 ENCOUNTER — Telehealth: Payer: Self-pay | Admitting: Internal Medicine

## 2011-09-18 MED ORDER — TRAMADOL HCL 50 MG PO TABS: 50.0000 mg | ORAL_TABLET | Freq: Three times a day (TID) | ORAL | Status: AC | PRN

## 2011-09-18 NOTE — Telephone Encounter (Signed)
Addended by: Etta Grandchild on: 09/18/2011 12:52 PM   Modules accepted: Orders

## 2011-09-18 NOTE — Telephone Encounter (Signed)
Try tramadol 

## 2011-09-18 NOTE — Telephone Encounter (Signed)
Pt want to know is there something else he can take other than tylenol.  Tylenol made his eye swell---pt ph# [phone]

## 2011-10-14 ENCOUNTER — Ambulatory Visit (INDEPENDENT_AMBULATORY_CARE_PROVIDER_SITE_OTHER): Payer: Medicare Other | Admitting: Internal Medicine

## 2011-10-14 ENCOUNTER — Encounter: Payer: Self-pay | Admitting: Internal Medicine

## 2011-10-14 VITALS — BP 120/72 | HR 60 | Temp 97.8°F | Resp 16

## 2011-10-14 DIAGNOSIS — T783XXA Angioneurotic edema, initial encounter: Secondary | ICD-10-CM

## 2011-10-14 NOTE — Progress Notes (Signed)
  Subjective:    Patient ID: Patrick [name], male    DOB: 11/10/1940, 71 y.o.   MRN: [id number]  HPI  He returns c/o a recurrence of swelling around the left side of his face (left eye area). When this last happened about 6 months ago he was taking nsaids. Most recently the only med he has taken was tylenol and the symptoms recurred. He was previously prescribed Zyrtec for this but he has not tried Zyrtec since the symptoms recurred about 2.5 weeks ago. He has not taken tylenol in about three weeks. He is not aware of anything he is taking or eating that may be causing this.  Review of Systems  Constitutional: Negative.   HENT: Negative.   Eyes: Negative for photophobia, pain, discharge, redness, itching and visual disturbance.  Respiratory: Negative for cough, chest tightness, shortness of breath, wheezing and stridor.   Cardiovascular: Negative.   Gastrointestinal: Negative.   Genitourinary: Negative.   Musculoskeletal: Negative.   Skin: Negative for color change, pallor, rash and wound.  Neurological: Negative.   Hematological: Negative for adenopathy. Does not bruise/bleed easily.       Objective:   Physical Exam  Vitals reviewed. Constitutional: He is oriented to person, place, and time. He appears well-developed and well-nourished.  Non-toxic appearance. He does not have a sickly appearance. He does not appear ill. No distress.  HENT:  Head: Normocephalic and atraumatic. Not macrocephalic and not microcephalic. Head is without right periorbital erythema and without left periorbital erythema.    Mouth/Throat: Oropharynx is clear and moist. No oropharyngeal exudate.  Eyes: Conjunctivae and EOM are normal. Pupils are equal, round, and reactive to light. Right eye exhibits no discharge. Left eye exhibits no discharge. No scleral icterus.  Neck: Normal range of motion. Neck supple. No JVD present. No tracheal deviation present. No thyromegaly present.  Cardiovascular: Normal rate,  regular rhythm, normal heart sounds and intact distal pulses.  Exam reveals no gallop and no friction rub.   No murmur heard. Pulmonary/Chest: Effort normal and breath sounds normal. No stridor. No respiratory distress. He has no wheezes. He has no rales. He exhibits no tenderness.  Abdominal: Soft. Bowel sounds are normal. He exhibits no distension and no mass. There is no tenderness. There is no rebound and no guarding.  Musculoskeletal: Normal range of motion. He exhibits no edema and no tenderness.  Lymphadenopathy:    He has no cervical adenopathy.  Neurological: He is alert and oriented to person, place, and time. He has normal reflexes.  Skin: Skin is warm and dry. No rash noted. He is not diaphoretic. No erythema. No pallor.  Psychiatric: He has a normal mood and affect. His behavior is normal. Judgment and thought content normal.     Lab Results  Component Value Date   WBC 5.6 10/29/2010   HGB 14.4 10/29/2010   HCT 41.5 10/29/2010   PLT 214.0 10/29/2010   GLUCOSE 97 10/29/2010   CHOL 183 10/29/2010   TRIG 161.0* 10/29/2010   HDL 47.20 10/29/2010   LDLDIRECT 128.8 10/11/2009   LDLCALC 104* 10/29/2010   ALT 19 10/29/2010   AST 23 10/29/2010   NA 140 10/29/2010   K 4.2 10/29/2010   CL 106 10/29/2010   CREATININE 0.8 05/13/2011   BUN 14 05/13/2011   CO2 30 10/29/2010   TSH 3.05 10/29/2010   PSA 0.56 10/11/2009   INR 1.0 ratio 09/06/2009       Assessment & Plan:

## 2011-10-14 NOTE — Assessment & Plan Note (Signed)
I do not know what is causing this but it looks like ANE to me, I think antihistamines will help him a lot so he will restart zyrtec, also I have asked him to see allergy to find out more about the cause and treatment

## 2011-10-14 NOTE — Patient Instructions (Signed)
Angioedema Angioedema (AE) is a sudden swelling of the eyelids, lips, lobes of ears, external genitalia, skin, and other parts of the body. AE can happen by itself. It usually begins during the night and is found on awakening. It can happen with hives and other allergic reactions. Attacks can be mild and annoying, or life-threatening if the air passages swell. AE generally occurs in a short time period (over minutes to hours) and gets better in 24 to 48 hours. It usually does not cause any serious problems.  There are 2 different kinds of AE:   Allergic AE.   Nonallergic AE.   There may be an overreaction or direct stimulation of cells that are a part of the immune system (mast cells).   There may be problems with the release of chemicals made by the body that cause swelling and inflammation (kinins). AE due to kinins can be inherited from parents (hereditary), or it can develop on its own (acquired). Acquired AE either shows up before, or along with, certain diseases or is due to the body's immune system attacking parts of the body's own cells (autoimmune).  CAUSES  Allergic  AE due to allergic reactions are caused by something that causes the body to react (trigger). Common triggers include:   Foods.   Medicines.   Latex.   Direct contact with certain fruits, vegetables, or animal saliva.   Insect stings.  Nonallergic  Mast cell stimulation may be caused by:   Medicines.   Dyes used in X-rays.   The body's own immune system reactions to parts of the body (autoimmune disease).   Possibly, some virus infections.   AE due to problems with kinins can be hereditary or acquired. Attacks are triggered by:   Mild injury.   Dental work or any surgery.   Stress.   Sudden changes in temperature.   Exercise.   Medicines.   AE due to problems with kinins can also be due to certain medicines, especially blood pressure medicines like angiotensin-converting enzyme (ACE)  inhibitors. African Americans are at nearly 5 times greater risk of developing AE than Caucasians from ACE inhibitors.  SYMPTOMS  Allergic symptoms:  Non-itchy swelling of the skin. Often the swelling is on the face and lips, but any area of the skin can swell. Sometimes, the swelling can be painful. If hives are present, there is intense itching.   Breathing problems if the air passages swell.  Nonallergic symptoms:  If internal organs are involved, there may be:   Nausea.   Abdominal pain.   Vomiting.   Difficulty swallowing.   Difficulty passing urine.   Breathing problems if the air passages swell.  Depending on the cause of AE, episodes may:  Only happen once (if triggers are removed or avoided).   Come back in unpredictable patterns.   Repeat for several years and then gradually fade away.  DIAGNOSIS  AE is diagnosed by:   Asking questions to find out how fast the symptoms began.   Taking a family history.   Physical exam.   Diagnostic tests. Tests could include:   Allergy skin tests to see if the problem is allergic.   Blood tests to diagnose hereditary and some acquired types of AE.   Other tests to see if there is a hidden disease leading to the AE.  TREATMENT  Treatment depends on the type and cause (if any) of the AE. Allergic  Allergic types of AE are treated with:   Immediate removal of   the trigger or medicine (if any).   Epinephrine injection.   Steroids.   Antihistamines.   Hospitalization for severe attacks.  Nonallergic  Mast cell stimulation types of AE are treated with:   Immediate removal of the trigger or medicine (if any).   Epinephrine injection.   Steroids.   Antihistamines.   Hospitalization for severe attacks.   Hereditary AE is treated with:   Medicines to prevent and treat attacks. There is little response to antihistamines, epinephrine, or steroids.   Preventive medicines before dental work or surgery.    Removing or avoiding medicines that trigger attacks.   Hospitalization for severe attacks.   Acquired AE is treated with:   Treating underlying disease (if any).   Medicines to prevent and treat attacks.  HOME CARE INSTRUCTIONS   Always carry your emergency allergy treatment medicines with you.   Wear a medical bracelet.   Avoid known triggers.  SEEK MEDICAL CARE IF:   You get repeat attacks.   Your attacks are more frequent or more severe despite preventive measures.   You have hereditary AE and are considering having children. It is important to discuss the risks of passing this on to your children.  SEEK IMMEDIATE MEDICAL CARE IF:   You have difficulty breathing.   You have difficulty swallowing.   You experience fainting.  This condition should be treated immediately. It can be life-threatening if it involves throat swelling. Document Released: 06/24/2001 Document Revised: 04/04/2011 Document Reviewed: 04/14/2008 Exit[employer] Patient Information 2012 [employer]. 

## 2011-10-28 ENCOUNTER — Ambulatory Visit (AMBULATORY_SURGERY_CENTER): Payer: Medicare Other | Admitting: *Deleted

## 2011-10-28 VITALS — Ht 74.5 in | Wt 206.0 lb

## 2011-10-28 DIAGNOSIS — Z1211 Encounter for screening for malignant neoplasm of colon: Secondary | ICD-10-CM

## 2011-10-28 MED ORDER — MOVIPREP 100 G PO SOLR: ORAL | Status: DC

## 2011-11-04 ENCOUNTER — Encounter: Payer: Self-pay | Admitting: Internal Medicine

## 2011-11-04 ENCOUNTER — Other Ambulatory Visit (INDEPENDENT_AMBULATORY_CARE_PROVIDER_SITE_OTHER): Payer: Medicare Other

## 2011-11-04 ENCOUNTER — Ambulatory Visit (INDEPENDENT_AMBULATORY_CARE_PROVIDER_SITE_OTHER): Payer: Medicare Other | Admitting: Internal Medicine

## 2011-11-04 VITALS — BP 120/72 | HR 84 | Temp 97.4°F | Resp 16 | Wt 210.5 lb

## 2011-11-04 DIAGNOSIS — Z0000 Encounter for general adult medical examination without abnormal findings: Secondary | ICD-10-CM

## 2011-11-04 DIAGNOSIS — I119 Hypertensive heart disease without heart failure: Secondary | ICD-10-CM | POA: Diagnosis not present

## 2011-11-04 DIAGNOSIS — E785 Hyperlipidemia, unspecified: Secondary | ICD-10-CM

## 2011-11-04 LAB — URINALYSIS, ROUTINE W REFLEX MICROSCOPIC
Nitrite: NEGATIVE
Specific Gravity, Urine: 1.025 (ref 1.000–1.030)
Total Protein, Urine: NEGATIVE
Urine Glucose: NEGATIVE

## 2011-11-04 LAB — COMPREHENSIVE METABOLIC PANEL
ALT: 15 U/L (ref 0–53)
CO2: 28 mEq/L (ref 19–32)
Chloride: 104 mEq/L (ref 96–112)
GFR: 90.97 mL/min (ref >60.00)
Potassium: 4 mEq/L (ref 3.5–5.1)
Sodium: 139 mEq/L (ref 135–145)
Total Bilirubin: 0.9 mg/dL (ref 0.3–1.2)
Total Protein: 7.6 g/dL (ref 6.0–8.3)

## 2011-11-04 LAB — CBC WITH DIFFERENTIAL/PLATELET
Basophils Absolute: 0 10*3/uL (ref 0.0–0.1)
HCT: 42.8 % (ref 39.0–52.0)
Lymphs Abs: 1.7 10*3/uL (ref 0.7–4.0)
Monocytes Absolute: 0.5 10*3/uL (ref 0.1–1.0)
Monocytes Relative: 10.5 % (ref 3.0–12.0)
Platelets: 216 10*3/uL (ref 150.0–400.0)
RDW: 13.5 % (ref 11.5–14.6)

## 2011-11-04 LAB — LIPID PANEL
Cholesterol: 185 mg/dL (ref 0–200)
VLDL: 30.2 mg/dL (ref 0.0–40.0)

## 2011-11-04 NOTE — Assessment & Plan Note (Signed)

## 2011-11-04 NOTE — Patient Instructions (Signed)
Health Maintenance, Males A healthy lifestyle and preventative care can promote health and wellness.  Maintain regular health, dental, and eye exams.   Eat a healthy diet. Foods like vegetables, fruits, whole grains, low-fat dairy products, and lean protein foods contain the nutrients you need without too many calories. Decrease your intake of foods high in solid fats, added sugars, and salt. Get information about a proper diet from your caregiver, if necessary.   Regular physical exercise is one of the most important things you can do for your health. Most adults should get at least 150 minutes of moderate-intensity exercise (any activity that increases your heart rate and causes you to sweat) each week. In addition, most adults need muscle-strengthening exercises on 2 or more days a week.    Maintain a healthy weight. The body mass index (BMI) is a screening tool to identify possible weight problems. It provides an estimate of body fat based on height and weight. Your caregiver can help determine your BMI, and can help you achieve or maintain a healthy weight. For adults 20 years and older:   A BMI below 18.5 is considered underweight.   A BMI of 18.5 to 24.9 is normal.   A BMI of 25 to 29.9 is considered overweight.   A BMI of 30 and above is considered obese.   Maintain normal blood lipids and cholesterol by exercising and minimizing your intake of saturated fat. Eat a balanced diet with plenty of fruits and vegetables. Blood tests for lipids and cholesterol should begin at age 20 and be repeated every 5 years. If your lipid or cholesterol levels are high, you are over 50, or you are a high risk for heart disease, you may need your cholesterol levels checked more frequently.Ongoing high lipid and cholesterol levels should be treated with medicines, if diet and exercise are not effective.   If you smoke, find out from your caregiver how to quit. If you do not use tobacco, do not start.    If you choose to drink alcohol, do not exceed 2 drinks per day. One drink is considered to be 12 ounces (355 mL) of beer, 5 ounces (148 mL) of wine, or 1.5 ounces (44 mL) of liquor.   Avoid use of street drugs. Do not share needles with anyone. Ask for help if you need support or instructions about stopping the use of drugs.   High blood pressure causes heart disease and increases the risk of stroke. Blood pressure should be checked at least every 1 to 2 years. Ongoing high blood pressure should be treated with medicines if weight loss and exercise are not effective.   If you are 45 to 70 years old, ask your caregiver if you should take aspirin to prevent heart disease.   Diabetes screening involves taking a blood sample to check your fasting blood sugar level. This should be done once every 3 years, after age 45, if you are within normal weight and without risk factors for diabetes. Testing should be considered at a younger age or be carried out more frequently if you are overweight and have at least 1 risk factor for diabetes.   Colorectal cancer can be detected and often prevented. Most routine colorectal cancer screening begins at the age of 50 and continues through age 75. However, your caregiver may recommend screening at an earlier age if you have risk factors for colon cancer. On a yearly basis, your caregiver may provide home test kits to check for hidden   blood in the stool. Use of a small camera at the end of a tube, to directly examine the colon (sigmoidoscopy or colonoscopy), can detect the earliest forms of colorectal cancer. Talk to your caregiver about this at age 50, when routine screening begins. Direct examination of the colon should be repeated every 5 to 10 years through age 75, unless early forms of pre-cancerous polyps or small growths are found.   Hepatitis C blood testing is recommended for all people born from 1945 through 1965 and any individual with known risks for  hepatitis C.   Healthy men should no longer receive prostate-specific antigen (PSA) blood tests as part of routine cancer screening. Consult with your caregiver about prostate cancer screening.   Testicular cancer screening is not recommended for adolescents or adult males who have no symptoms. Screening includes self-exam, caregiver exam, and other screening tests. Consult with your caregiver about any symptoms you have or any concerns you have about testicular cancer.   Practice safe sex. Use condoms and avoid high-risk sexual practices to reduce the spread of sexually transmitted infections (STIs).   Use sunscreen with a sun protection factor (SPF) of 30 or greater. Apply sunscreen liberally and repeatedly throughout the day. You should seek shade when your shadow is shorter than you. Protect yourself by wearing long sleeves, pants, a wide-brimmed hat, and sunglasses year round, whenever you are outdoors.   Notify your caregiver of new moles or changes in moles, especially if there is a change in shape or color. Also notify your caregiver if a mole is larger than the size of a pencil eraser.   A one-time screening for abdominal aortic aneurysm (AAA) and surgical repair of large AAAs by sound wave imaging (ultrasonography) is recommended for ages 65 to 75 years who are current or former smokers.   Stay current with your immunizations.  Document Released: 10/12/2007 Document Revised: 04/04/2011 Document Reviewed: 09/10/2010 Exit[employer] Patient Information 2012 [employer]. 

## 2011-11-04 NOTE — Progress Notes (Signed)
  Subjective:    Patient ID: Patrick [name], male    DOB: 04-Feb-1941, 71 y.o.   MRN: [id number]  Hypertension This is a chronic problem. The current episode started more than 1 year ago. The problem has been gradually improving since onset. The problem is controlled. Pertinent negatives include no anxiety, blurred vision, chest pain, headaches, malaise/fatigue, neck pain, orthopnea, palpitations, peripheral edema, PND, shortness of breath or sweats. Past treatments include nothing. There are no compliance problems.       Review of Systems  Constitutional: Negative.  Negative for malaise/fatigue.  HENT: Negative.  Negative for neck pain.   Eyes: Negative.  Negative for blurred vision.  Respiratory: Negative.  Negative for shortness of breath.   Cardiovascular: Negative.  Negative for chest pain, palpitations, orthopnea and PND.  Gastrointestinal: Negative.   Genitourinary: Negative.   Musculoskeletal: Negative.   Skin: Negative.   Neurological: Negative.  Negative for headaches.  Hematological: Negative.   Psychiatric/Behavioral: Negative.        Objective:   Physical Exam  Vitals reviewed. Constitutional: He is oriented to person, place, and time. He appears well-developed and well-nourished. No distress.  HENT:  Head: Normocephalic and atraumatic.  Mouth/Throat: Oropharynx is clear and moist. No oropharyngeal exudate.  Eyes: Conjunctivae are normal. Right eye exhibits no discharge. Left eye exhibits no discharge. No scleral icterus.  Neck: Normal range of motion. Neck supple. No JVD present. No tracheal deviation present. No thyromegaly present.  Cardiovascular: Normal rate, regular rhythm, normal heart sounds and intact distal pulses.  Exam reveals no gallop and no friction rub.   No murmur heard. Pulmonary/Chest: Effort normal and breath sounds normal. No stridor. No respiratory distress. He has no wheezes. He has no rales. He exhibits no tenderness.  Abdominal: Soft. Bowel  sounds are normal. He exhibits no distension and no mass. There is no tenderness. There is no rebound and no guarding.  Musculoskeletal: Normal range of motion. He exhibits no edema and no tenderness.  Lymphadenopathy:    He has no cervical adenopathy.  Neurological: He is oriented to person, place, and time.  Skin: Skin is warm and dry. No rash noted. He is not diaphoretic. No erythema. No pallor.  Psychiatric: He has a normal mood and affect. His behavior is normal. Judgment and thought content normal.      Lab Results  Component Value Date   WBC 5.6 10/29/2010   HGB 14.4 10/29/2010   HCT 41.5 10/29/2010   PLT 214.0 10/29/2010   GLUCOSE 97 10/29/2010   CHOL 183 10/29/2010   TRIG 161.0* 10/29/2010   HDL 47.20 10/29/2010   LDLDIRECT 128.8 10/11/2009   LDLCALC 104* 10/29/2010   ALT 19 10/29/2010   AST 23 10/29/2010   NA 140 10/29/2010   K 4.2 10/29/2010   CL 106 10/29/2010   CREATININE 0.8 05/13/2011   BUN 14 05/13/2011   CO2 30 10/29/2010   TSH 3.05 10/29/2010   PSA 0.56 10/11/2009   INR 1.0 ratio 09/06/2009      Assessment & Plan:

## 2011-11-11 ENCOUNTER — Encounter: Payer: Self-pay | Admitting: Internal Medicine

## 2011-11-11 ENCOUNTER — Ambulatory Visit (AMBULATORY_SURGERY_CENTER): Payer: Medicare Other | Admitting: Internal Medicine

## 2011-11-11 VITALS — BP 140/73 | HR 70 | Temp 96.5°F | Resp 16 | Ht 74.5 in | Wt 206.0 lb

## 2011-11-11 DIAGNOSIS — Z1211 Encounter for screening for malignant neoplasm of colon: Secondary | ICD-10-CM

## 2011-11-11 DIAGNOSIS — D126 Benign neoplasm of colon, unspecified: Secondary | ICD-10-CM

## 2011-11-11 MED ORDER — SODIUM CHLORIDE 0.9 % IV SOLN: 500.0000 mL | INTRAVENOUS | Status: DC

## 2011-11-11 NOTE — Progress Notes (Signed)
Patient did not have preoperative order for IV antibiotic SSI prophylaxis. (G8918)  Patient did not experience any of the following events: a burn prior to discharge; a fall within the facility; wrong site/side/patient/procedure/implant event; or a hospital transfer or hospital admission upon discharge from the facility. (G8907)  

## 2011-11-11 NOTE — Op Note (Signed)
[facility] [address] [place], [place]  [address]  COLONOSCOPY PROCEDURE REPORT  PATIENT:  Patrick [name], Patrick [name]  MR#:  [id number] BIRTHDATE:  [birth year], 71 yrs. old  GENDER:  male ENDOSCOPIST:  Wilhemina Bonito. Eda Keys, MD REF. BY:  Screening / Recall PROCEDURE DATE:  11/11/2011 PROCEDURE:  Colonoscopy with snare polypectomy x 1 ASA CLASS:  Class II INDICATIONS:  Routine Risk Screening MEDICATIONS:   MAC sedation, administered by CRNA, propofol (Diprivan) 150 mg IV  DESCRIPTION OF PROCEDURE:   After the risks benefits and alternatives of the procedure were thoroughly explained, informed consent was obtained.  Digital rectal exam was performed and revealed no abnormalities.   The LB CF-[id number] endoscope was introduced through the anus and advanced to the cecum, which was identified by both the appendix and ileocecal valve, without limitations.  The quality of the prep was excellent, using MoviPrep.  The instrument was then slowly withdrawn as the colon was fully examined. <<PROCEDUREIMAGES>>  FINDINGS:  A41mm sessile polyp was found in the ascending colon and snared without cautery. Retrieval was successful. Scattered diverticula were found.  There were mucosal changes consistent with mild radiation proctitis in the rectum.  Otherwise normal colonoscopy without other polyps, masses, vascular ectasias, or inflammatory changes.   Retroflexed views in the rectum revealed no abnormalities.    The time to cecum = 4:05  minutes. The scope was then withdrawn in 9:30  minutes from the cecum and the procedure completed.  COMPLICATIONS:  None  ENDOSCOPIC IMPRESSION: 1) Sessile polyp in the ascending colon - removed 2) Diverticula, scattered 3) Radiation proctitis (mild) 4) Otherwise normal colonoscopy  RECOMMENDATIONS: 1) Follow up colonoscopy in 5 years  ______________________________ Wilhemina Bonito. Eda Keys, MD  CC:  Etta Grandchild, MD;   The Patient  n. eSIGNED:   Wilhemina Bonito.  Eda Keys at 11/11/2011 12:44 PM  [place], [phone]

## 2011-11-11 NOTE — Patient Instructions (Addendum)

## 2011-11-12 ENCOUNTER — Telehealth: Payer: Self-pay | Admitting: *Deleted

## 2011-11-12 NOTE — Telephone Encounter (Signed)
  Follow up Call-  Call back number 11/11/2011  Post procedure Call Back phone  # [phone]  Permission to leave phone message Yes     Patient questions:  Do you have a fever, pain , or abdominal swelling? no Pain Score  0 *  Have you tolerated food without any problems? yes  Have you been able to return to your normal activities? yes  Do you have any questions about your discharge instructions: Diet   no Medications  no Follow up visit  no  Do you have questions or concerns about your Care? no  Actions: * If pain score is 4 or above: No action needed, pain <4.

## 2011-11-14 ENCOUNTER — Encounter: Payer: Self-pay | Admitting: Internal Medicine

## 2011-11-28 ENCOUNTER — Other Ambulatory Visit: Payer: Medicare Other

## 2011-11-28 ENCOUNTER — Ambulatory Visit (INDEPENDENT_AMBULATORY_CARE_PROVIDER_SITE_OTHER): Payer: Medicare Other | Admitting: Internal Medicine

## 2011-11-28 ENCOUNTER — Encounter: Payer: Self-pay | Admitting: Internal Medicine

## 2011-11-28 VITALS — BP 150/80 | HR 102 | Ht 74.5 in | Wt 210.0 lb

## 2011-11-28 DIAGNOSIS — J309 Allergic rhinitis, unspecified: Secondary | ICD-10-CM

## 2011-11-28 DIAGNOSIS — J302 Other seasonal allergic rhinitis: Secondary | ICD-10-CM

## 2011-11-28 DIAGNOSIS — T783XXA Angioneurotic edema, initial encounter: Secondary | ICD-10-CM

## 2011-11-28 NOTE — Progress Notes (Signed)
11/28/11- 71 yoM never smoker referred courtesy of Dr Yetta Barre for allergy evaluation concerned about facial swelling.  Wife is here. Wakes up having swelling in Left Eye-happens overnight-last episode was June 17,2013-? pollen attacks causing it? The first episode was about a year ago. Episodes have begun with lip swelling. Usually he gets left. Orbital edema noticed on waking in the morning. Episodes last 3 or 4 days then resolve. They're more common when he has been outside such as golfing or yard work. Most recent was 10/14/2011. He takes Zyrtec if he thinks he needs it as a preventative. He has a long history of nasal congestion and perennial stuffiness with drainage but has not considered himself significantly allergic. There is no history of cough, wheeze or asthma. Swelling episodes seemed better after he stopped taking aspirin last winter. Father had history of sinus disease without urticaria. Medical history significant for prostate seed therapy for cancer 3 years ago. Tonsillectomy. Hx SVT/ ablation. CT max fac- 05/15/11 IMPRESSION:  Asymmetric left malar and left supraorbital facial swelling with  some type of process replacing the subcutaneous fat. Cellulitis  would be one consideration. An infiltrative dermatologic neoplasm  is not excluded. There is no underlying osseous destruction or  secondary reaction from paranasal sinus disease.  Original Report Authenticated By: Elsie Stain, M.D.    Prior to Admission medications   Medication Sig Start Date End Date Taking? Authorizing Provider  acetaminophen (TYLENOL) 500 MG tablet Take 500 mg by mouth every 6 (six) hours as needed.   Yes Historical Provider, MD  cetirizine (ZYRTEC) 10 MG tablet Take 1 tablet (10 mg total) by mouth daily. 05/14/11 05/13/12 Yes Etta Grandchild, MD  Multiple Vitamin (MULTIVITAMIN) tablet Take 1 tablet by mouth daily.     Yes Historical Provider, MD  vitamin C (ASCORBIC ACID) 500 MG tablet Take 500 mg by mouth  daily.     Yes Historical Provider, MD   Past Medical History  Diagnosis Date  . Atrial tachycardia   . Rheumatic heart disease     71yrs old  . Diverticulosis   . Hemorrhoids   . Blood transfusion 1944  . Hyperlipemia   . Prostate cancer     Gleason 6 status post radiation seed implant September 2010   Past Surgical History  Procedure Date  . Inguinal hernia repair 01/2006    right  . Total knee arthroplasty 1/11    s/p left  . Tonsillectomy and adenoidectomy 1945  . Cataract extraction w/ intraocular lens implant 2009    right  . Cardiac electrophysiology study and ablation 2011    for SVT  . Wrist fusion 2006    04/2004   Family History  Problem Relation Age of Onset  . Arthritis Other   . Cancer Other     Breast cancer 1st degree relative<50  . Breast cancer Mother   . Breast cancer Maternal Grandmother   . Colon cancer Neg Hx    History   Social History  . Marital Status: Married    Spouse Name: N/A    Number of Children: N/A  . Years of Education: N/A   Occupational History  . Retired Surveyor, [employer]   Social History Main Topics  . Smoking status: Never Smoker   . Smokeless tobacco: Never Used  . Alcohol Use: No  . Drug Use: No  . Sexually Active: Not Currently   Other Topics Concern  . Not on file   Social History Narrative   Regular  exercise-YesMarriedLives in [place]   ROS-see HPI Constitutional:   No-   weight loss, night sweats, fevers, chills, fatigue, lassitude. HEENT:   No-  headaches, difficulty swallowing, tooth/dental problems, sore throat,       No-  sneezing, itching, ear ache,  +nasal congestion, post nasal drip,  CV:  No-   chest pain, orthopnea, PND, swelling in lower extremities, anasarca, dizziness, palpitations Resp: No-   shortness of breath with exertion or at rest.              No-   productive cough,  No non-productive cough,  No- coughing up of blood.              No-   change in color of mucus.  No- wheezing.     Skin: See history of present illness GI:  No-   heartburn, indigestion, abdominal pain, nausea, vomiting, diarrhea,                 change in bowel habits, loss of appetite GU: No-   dysuria, change in color of urine, no urgency or frequency.  No- flank pain. MS:  No-   joint pain or swelling.  No- decreased range of motion.  No- back pain. Neuro-     nothing unusual Psych:  No- change in mood or affect. No depression or anxiety.  No memory loss.  OBJ- Physical Exam General- Alert, Oriented, Affect-appropriate, Distress- none acute Skin- rash-none, lesions- none, excoriation- none Lymphadenopathy- none Head- atraumatic            Eyes- Gross vision intact, PERRLA, conjunctivae and secretions clear            Ears- Hearing, canals-normal            Nose- Clear, no-Septal dev, mucus, polyps, erosion, perforation             Throat- Mallampati II , mucosa clear , drainage- none, tonsils- atrophic Neck- flexible , trachea midline, no stridor , thyroid nl, carotid no bruit Chest - symmetrical excursion , unlabored           Heart/CV- RRR , no murmur , no gallop  , no rub, nl s1 s2                           - JVD- none , edema- none, stasis changes- none, varices- none           Lung- clear to P&A, wheeze- none, cough- none , dullness-none, rub- none           Chest wall-  Abd- tender-no, distended-no, bowel sounds-present, HSM- no Br/ Gen/ Rectal- Not done, not indicated Extrem- cyanosis- none, clubbing, none, atrophy- none, strength- nl Neuro- grossly intact to observation

## 2011-11-28 NOTE — Patient Instructions (Addendum)
Order- lab Allergy Profile  Dx allergic rhintis  Consider nonsedating antihistamines for use before exposure or as needed:  Claritin/ loratadine or Allegra/ fexofenadine  Sample Dysmista nasal spray     1-2 sprays each nostril once daily at bedtime  Saline rinse, as with a Neti pot, might help nasal congestion at times.

## 2011-12-02 LAB — ALLERGY FULL PROFILE
Allergen, D pternoyssinus,d7: <0.1 kU/L
Aspergillus fumigatus, IgG: <0.1 kU/L
Bermuda Grass: <0.1 kU/L
Box Elder IgE: <0.1 kU/L
Candida Albicans: <0.1 kU/L
D. farinae: <0.1 kU/L
Elm IgE: <0.1 kU/L
G005 Rye, Perennial: <0.1 kU/L
G009 Red Top: <0.1 kU/L
Oak: <0.1 kU/L
Plantain: <0.1 kU/L
Timothy Grass: <0.1 kU/L

## 2011-12-04 DIAGNOSIS — T783XXA Angioneurotic edema, initial encounter: Secondary | ICD-10-CM | POA: Insufficient documentation

## 2011-12-04 DIAGNOSIS — J302 Other seasonal allergic rhinitis: Secondary | ICD-10-CM | POA: Insufficient documentation

## 2011-12-04 NOTE — Assessment & Plan Note (Signed)
Resume, because of the episodic nature of his swelling, that with the radiologist saw was edema in the subcutaneous fat. We discussed possibility of a repeat CT for comparison. The timing he describes as a little unusual, waking in the morning with it. He thinks most consistent association is with significant time spent outdoors. Consider the possibility that this is an IgE to Alpha-gal/ galactose 1-3 galactose reaction, perhaps to meat at supper the night before.

## 2011-12-04 NOTE — Assessment & Plan Note (Signed)
We will assess for IgE activation with in vitro allergy profile initially. Antihistamines if needed.

## 2011-12-05 NOTE — Progress Notes (Signed)
Quick Note:  Pt aware of results. ______ 

## 2011-12-09 ENCOUNTER — Other Ambulatory Visit: Payer: Self-pay | Admitting: Dermatology

## 2011-12-09 DIAGNOSIS — L821 Other seborrheic keratosis: Secondary | ICD-10-CM | POA: Diagnosis not present

## 2011-12-09 DIAGNOSIS — D485 Neoplasm of uncertain behavior of skin: Secondary | ICD-10-CM | POA: Diagnosis not present

## 2011-12-09 DIAGNOSIS — Z411 Encounter for cosmetic surgery: Secondary | ICD-10-CM | POA: Diagnosis not present

## 2011-12-09 DIAGNOSIS — Z85828 Personal history of other malignant neoplasm of skin: Secondary | ICD-10-CM | POA: Diagnosis not present

## 2011-12-09 DIAGNOSIS — D239 Other benign neoplasm of skin, unspecified: Secondary | ICD-10-CM | POA: Diagnosis not present

## 2012-01-13 ENCOUNTER — Other Ambulatory Visit: Payer: Medicare Other

## 2012-01-13 ENCOUNTER — Ambulatory Visit (INDEPENDENT_AMBULATORY_CARE_PROVIDER_SITE_OTHER): Payer: Medicare Other | Admitting: Internal Medicine

## 2012-01-13 ENCOUNTER — Encounter: Payer: Self-pay | Admitting: Internal Medicine

## 2012-01-13 VITALS — BP 140/82 | HR 71 | Ht 74.5 in | Wt 213.4 lb

## 2012-01-13 DIAGNOSIS — T783XXA Angioneurotic edema, initial encounter: Secondary | ICD-10-CM

## 2012-01-13 DIAGNOSIS — R930 Abnormal findings on diagnostic imaging of skull and head, not elsewhere classified: Secondary | ICD-10-CM

## 2012-01-13 NOTE — Patient Instructions (Addendum)
Order- CT maxillofacial  No contrast     Dx Angioedema, abnormal CT maxillofacial  Aspirin IgE  Avoid aspirin and be very suspicious about related non-steroidal analgesics like ibuprofen, Aleve.

## 2012-01-13 NOTE — Progress Notes (Signed)
11/28/11- 71 yoM never smoker referred courtesy of Dr Yetta Barre for allergy evaluation concerned about facial swelling 17,2013-? pollen attacks causing it? The first episode was about a year ago. Episodes have begun with lip swelling. Usually he gets left orbital edema noticed on waking in the morning. Episodes last 3 or 4 days then resolve. They're more common when he has been outside such as golfing or yard work. Most recent was 10/14/2011. He takes Zyrtec if he thinks he needs it as a preventative. He has a long history of nasal congestion and perennial stuffiness with drainage but has not considered himself significantly allergic. There is no history of cough, wheeze or asthma. Swelling episodes seemed better after he stopped taking aspirin last winter. Father had history of sinus disease without urticaria. Medical history significant for prostate seed therapy for cancer 3 years ago. Tonsillectomy. Hx SVT/ ablation. CT max fac- 05/15/11 IMPRESSION:  Asymmetric left malar and left supraorbital facial swelling with  some type of process replacing the subcutaneous fat. Cellulitis ng.  would be one consideration. An infiltrative dermatologic neoplasm  is not excluded. There is no underlying osseous destruction or  secondary reaction from paranasal sinus disease.  Original Report Authenticated By: Elsie Stain, M.D.    01/13/12-71 yoM never smoker referred courtesy of Dr Yetta Barre for allergy evaluation concerned about facial swelling -? pollen attacks causing it? Still has not taken ASA since January - he had blamed aspirin-; no flare ups since 09-2011. He has been out doors a lot suspected of being a trigger. Occasional left frontal headache he is outdoors a lot. No history of seasonal rhinitis. Allergy Profile 12/03/2011-total IgE 34.6 with no specific elevations  ROS-see HPI Constitutional:   No-   weight loss, night sweats, fevers, chills, fatigue, lassitude. HEENT:   No-  headaches, difficulty  swallowing, tooth/dental problems, sore throat,       No-  sneezing, itching, ear ache,  +nasal congestion, post nasal drip,  CV:  No-   chest pain, orthopnea, PND, swelling in lower extremities, anasarca, dizziness, palpitations Resp: No-   shortness of breath with exertion or at rest.              No-   productive cough,  No non-productive cough,  No- coughing up of blood.              No-   change in color of mucus.  No- wheezing.   Skin: See history of present illness GI:  No-   heartburn, indigestion, abdominal pain, nausea, vomiting,  GU:  MS:  No-   joint pain or swelling.  Neuro-     nothing unusual Psych:  No- change in mood or affect. No depression or anxiety.  No memory loss.  OBJ- Physical Exam General- Alert, Oriented, Affect-appropriate, Distress- none acute Skin- rash-none, lesions- none, excoriation- none Lymphadenopathy- none Head- atraumatic            Eyes- Gross vision intact, PERRLA, conjunctivae and secretions clear            Ears- Hearing, canals-normal            Nose- Clear, no-Septal dev, mucus, polyps, erosion, perforation             Throat- Mallampati II , mucosa clear , drainage- none, tonsils- atrophic Neck- flexible , trachea midline, no stridor , thyroid nl, carotid no bruit Chest - symmetrical excursion , unlabored           Heart/CV- RRR ,  no murmur , no gallop  , no rub, nl s1 s2                           - JVD- none , edema- none, stasis changes- none, varices- none           Lung- clear to P&A, wheeze- none, cough- none , dullness-none, rub- none           Chest wall-  Abd- Br/ Gen/ Rectal- Not done, not indicated Extrem- cyanosis- none, clubbing, none, atrophy- none, strength- nl Neuro- grossly intact to observation

## 2012-01-20 ENCOUNTER — Telehealth: Payer: Self-pay | Admitting: Internal Medicine

## 2012-01-20 ENCOUNTER — Ambulatory Visit (INDEPENDENT_AMBULATORY_CARE_PROVIDER_SITE_OTHER)
Admission: RE | Admit: 2012-01-20 | Discharge: 2012-01-20 | Disposition: A | Discharge status: Home / Self Care | Payer: Medicare Other | Source: Ambulatory Visit | Attending: Internal Medicine | Admitting: Internal Medicine

## 2012-01-20 DIAGNOSIS — T783XXA Angioneurotic edema, initial encounter: Secondary | ICD-10-CM

## 2012-01-20 DIAGNOSIS — H2510 Age-related nuclear cataract, unspecified eye: Secondary | ICD-10-CM | POA: Diagnosis not present

## 2012-01-20 DIAGNOSIS — R930 Abnormal findings on diagnostic imaging of skull and head, not elsewhere classified: Secondary | ICD-10-CM

## 2012-01-20 NOTE — Telephone Encounter (Signed)
Per [facility], pt should have sinus ct done today as scheduled. Pt [place] aware.

## 2012-01-20 NOTE — Telephone Encounter (Signed)
Calling again in ref to previous msg needs to speak to someone asap because his ct is scheduled for today can be reached at [phone].Raylene Everts

## 2012-01-20 NOTE — Telephone Encounter (Signed)
Pt is scheduled for a sinus ct this afternoon and wanted to know if he should still have this done since he is having sinus trouble that atayesterday morning and is swollen under his left eye. He though Dr. Maple Hudson wanted this done when he wasn't having any trouble. Pls advise.

## 2012-01-20 NOTE — Assessment & Plan Note (Addendum)
Angioedema is a working diagnosis. We talked about getting a CT scan during a remission, because an infiltrative process" as described on CT scan, would not be expected, and go. Allergy profile results do not suggest IgE mediated allergy is likely to be a problem Plan-we will repeat a CT scan during remission for comparison with the "infiltrate" scan of January 2013

## 2012-01-21 ENCOUNTER — Telehealth: Payer: Self-pay | Admitting: Internal Medicine

## 2012-01-21 LAB — ALLERGEN ASPIRIN/SALICYLIC ACID IGE

## 2012-01-21 NOTE — Telephone Encounter (Signed)
I called Dr Kearney Hard to discuss comparison of recent limited CT max/face to the January study which was full study w/ contrast.  For future, we should order full, but don't need cm. The "infiltrative" changes might have been edema, but Hounsfield measure more like muscle density. Neoplasm won't come and go, so this was lymphedema.

## 2012-01-24 NOTE — Progress Notes (Signed)
Quick Note:  Pt aware of results. ______ 

## 2012-02-28 DIAGNOSIS — Z23 Encounter for immunization: Secondary | ICD-10-CM | POA: Diagnosis not present

## 2012-03-23 ENCOUNTER — Ambulatory Visit (INDEPENDENT_AMBULATORY_CARE_PROVIDER_SITE_OTHER): Payer: Medicare Other | Admitting: Internal Medicine

## 2012-03-23 ENCOUNTER — Encounter: Payer: Self-pay | Admitting: Internal Medicine

## 2012-03-23 VITALS — BP 136/90 | HR 92 | Ht 75.0 in | Wt 212.0 lb

## 2012-03-23 DIAGNOSIS — T783XXA Angioneurotic edema, initial encounter: Secondary | ICD-10-CM

## 2012-03-23 NOTE — Progress Notes (Signed)
11/28/11- 71 yoM never smoker referred courtesy of Dr Yetta Barre for allergy evaluation concerned about facial swelling 17,2013-? pollen attacks causing it? The first episode was about a year ago. Episodes have begun with lip swelling. Usually he gets left orbital edema noticed on waking in the morning. Episodes last 3 or 4 days then resolve. They're more common when he has been outside such as golfing or yard work. Most recent was 10/14/2011. He takes Zyrtec if he thinks he needs it as a preventative. He has a long history of nasal congestion and perennial stuffiness with drainage but has not considered himself significantly allergic. There is no history of cough, wheeze or asthma. Swelling episodes seemed better after he stopped taking aspirin last winter. Father had history of sinus disease without urticaria. Medical history significant for prostate seed therapy for cancer 3 years ago. Tonsillectomy. Hx SVT/ ablation. CT max fac- 05/15/11 IMPRESSION:  Asymmetric left malar and left supraorbital facial swelling with  some type of process replacing the subcutaneous fat. Cellulitis ng.  would be one consideration. An infiltrative dermatologic neoplasm  is not excluded. There is no underlying osseous destruction or  secondary reaction from paranasal sinus disease.  Original Report Authenticated By: Elsie Stain, M.D.    01/13/12-71 yoM never smoker referred courtesy of Dr Yetta Barre for allergy evaluation concerned about facial swelling -? pollen attacks causing it? Still has not taken ASA since January - he had blamed aspirin-; no flare ups since 09-2011. He has been out doors a lot suspected of being a trigger. Occasional left frontal headache he is outdoors a lot. No history of seasonal rhinitis. Allergy Profile 12/03/2011-total IgE 34.6 with no specific elevations  03/23/12- 71 yoM never smoker referred courtesy of Dr Yetta Barre for allergy evaluation concerned about facial swelling -? pollen attacks causing  it? Follows For:  Feeling better - Denies facial swelling - Hoarseness since last week - Sinus congestion There been no more facial swelling episodes since last here. He has an incidental, unremarkable cold which is getting better just using saline nasal spray. He has avoided aspirin products out of suspicion that they might be a trigger. CT maxillofacial- reviewed with him. IMPRESSION:  Paranasal sinuses clear.  Original Report Authenticated By: Cyndie Chime, M.D.   ROS-see HPI Constitutional:   No-   weight loss, night sweats, fevers, chills, fatigue, lassitude. HEENT:   No-  headaches, difficulty swallowing, tooth/dental problems, sore throat,       No-  sneezing, itching, ear ache,  +nasal congestion, post nasal drip,  CV:  No-   chest pain, orthopnea, PND, swelling in lower extremities, anasarca, dizziness, palpitations Resp: No-   shortness of breath with exertion or at rest.              No-   productive cough,  No non-productive cough,  No- coughing up of blood.              No-   change in color of mucus.  No- wheezing.   Skin: See history of present illness GI:  No-   heartburn, indigestion, abdominal pain, nausea, vomiting,  GU:  MS:  No-   joint pain or swelling.  Neuro-     nothing unusual Psych:  No- change in mood or affect. No depression or anxiety.  No memory loss.  OBJ- Physical Exam General- Alert, Oriented, Affect-appropriate, Distress- none acute Skin- rash-none, lesions- none, excoriation- none Lymphadenopathy- none Head- atraumatic  Eyes- Gross vision intact, PERRLA, conjunctivae and secretions clear            Ears- Hearing, canals-normal            Nose- + sniffing and nasal stuffiness, no-Septal dev, mucus, polyps, erosion, perforation             Throat- Mallampati II , mucosa clear , drainage- none, tonsils- atrophic Neck- flexible , trachea midline, no stridor , thyroid nl, carotid no bruit Chest - symmetrical excursion , unlabored            Heart/CV- RRR , no murmur , no gallop  , no rub, nl s1 s2                           - JVD- none , edema- none, stasis changes- none, varices- none           Lung- clear to P&A, wheeze- none, cough- none , dullness-none, rub- none           Chest wall-  Abd- Br/ Gen/ Rectal- Not done, not indicated Extrem- cyanosis- none, clubbing, none, atrophy- none, strength- nl Neuro- grossly intact to observation

## 2012-03-23 NOTE — Patient Instructions (Addendum)
I agree it is reasonable to question aspirin allergy as the trigger for your facial swelling. However the aspirin antibody test was negative  Don't take aspirin or products containing aspirin, like Alka-Seltzer without discussing with your physician at the time. Be careful about other "NSAIDs" like ibuprofen, naprosyn etc, since they might do the same thing.   Please call as needed

## 2012-03-24 ENCOUNTER — Other Ambulatory Visit: Payer: Self-pay | Admitting: Dermatology

## 2012-03-24 DIAGNOSIS — B358 Other dermatophytoses: Secondary | ICD-10-CM | POA: Diagnosis not present

## 2012-03-24 DIAGNOSIS — L259 Unspecified contact dermatitis, unspecified cause: Secondary | ICD-10-CM | POA: Diagnosis not present

## 2012-04-05 NOTE — Assessment & Plan Note (Signed)
Idiopathic facial edema now in remission. Hopefully it will not return.

## 2012-07-15 DIAGNOSIS — Z8546 Personal history of malignant neoplasm of prostate: Secondary | ICD-10-CM | POA: Diagnosis not present

## 2012-07-20 DIAGNOSIS — R351 Nocturia: Secondary | ICD-10-CM | POA: Diagnosis not present

## 2012-07-20 DIAGNOSIS — N529 Male erectile dysfunction, unspecified: Secondary | ICD-10-CM | POA: Diagnosis not present

## 2012-07-20 DIAGNOSIS — Z8546 Personal history of malignant neoplasm of prostate: Secondary | ICD-10-CM | POA: Diagnosis not present

## 2012-11-09 ENCOUNTER — Other Ambulatory Visit (INDEPENDENT_AMBULATORY_CARE_PROVIDER_SITE_OTHER): Payer: Medicare Other

## 2012-11-09 ENCOUNTER — Encounter: Payer: Self-pay | Admitting: Internal Medicine

## 2012-11-09 ENCOUNTER — Ambulatory Visit (INDEPENDENT_AMBULATORY_CARE_PROVIDER_SITE_OTHER): Payer: Medicare Other | Admitting: Internal Medicine

## 2012-11-09 VITALS — BP 138/90 | HR 69 | Temp 98.2°F | Resp 16 | Ht 75.0 in | Wt 210.8 lb

## 2012-11-09 DIAGNOSIS — L570 Actinic keratosis: Secondary | ICD-10-CM

## 2012-11-09 DIAGNOSIS — R7309 Other abnormal glucose: Secondary | ICD-10-CM

## 2012-11-09 DIAGNOSIS — Z0000 Encounter for general adult medical examination without abnormal findings: Secondary | ICD-10-CM | POA: Diagnosis not present

## 2012-11-09 DIAGNOSIS — E785 Hyperlipidemia, unspecified: Secondary | ICD-10-CM

## 2012-11-09 DIAGNOSIS — I119 Hypertensive heart disease without heart failure: Secondary | ICD-10-CM

## 2012-11-09 DIAGNOSIS — Z91199 Patient's noncompliance with other medical treatment and regimen due to unspecified reason: Secondary | ICD-10-CM

## 2012-11-09 DIAGNOSIS — Z9119 Patient's noncompliance with other medical treatment and regimen: Secondary | ICD-10-CM

## 2012-11-09 DIAGNOSIS — E669 Obesity, unspecified: Secondary | ICD-10-CM | POA: Diagnosis not present

## 2012-11-09 DIAGNOSIS — Z9114 Patient's other noncompliance with medication regimen: Secondary | ICD-10-CM

## 2012-11-09 LAB — HEMOGLOBIN A1C: Hgb A1c MFr Bld: 5.6 % (ref 4.6–6.5)

## 2012-11-09 LAB — CBC WITH DIFFERENTIAL/PLATELET
Basophils Absolute: 0 10*3/uL (ref 0.0–0.1)
Eosinophils Absolute: 0.1 10*3/uL (ref 0.0–0.7)
HCT: 42.2 % (ref 39.0–52.0)
Lymphs Abs: 1.8 10*3/uL (ref 0.7–4.0)
MCHC: 34.2 g/dL (ref 30.0–36.0)
MCV: 97.2 fl (ref 78.0–100.0)
Monocytes Absolute: 0.6 10*3/uL (ref 0.1–1.0)
Monocytes Relative: 13.3 % — ABNORMAL HIGH (ref 3.0–12.0)
Platelets: 214 10*3/uL (ref 150.0–400.0)
RDW: 13.6 % (ref 11.5–14.6)

## 2012-11-09 LAB — COMPREHENSIVE METABOLIC PANEL
ALT: 18 U/L (ref 0–53)
CO2: 29 mEq/L (ref 19–32)
GFR: 66.61 mL/min (ref >60.00)
Sodium: 139 mEq/L (ref 135–145)
Total Bilirubin: 1.1 mg/dL (ref 0.3–1.2)
Total Protein: 7.4 g/dL (ref 6.0–8.3)

## 2012-11-09 LAB — LIPID PANEL
HDL: 38.8 mg/dL — ABNORMAL LOW (ref >39.00)
Total CHOL/HDL Ratio: 5

## 2012-11-09 NOTE — Assessment & Plan Note (Signed)
I will check his A1C to see if he has developed DM2 

## 2012-11-09 NOTE — Progress Notes (Signed)
Subjective:    Patient ID: Patrick [name], male    DOB: 06-09-1940, 72 y.o.   MRN: [id number]  Hyperlipidemia This is a chronic problem. The current episode started more than 1 year ago. The problem is uncontrolled. Recent lipid tests were reviewed and are variable. Exacerbating diseases include obesity. He has no history of chronic renal disease, diabetes, hypothyroidism, liver disease or nephrotic syndrome. There are no known factors aggravating his hyperlipidemia. Pertinent negatives include no chest pain, focal sensory loss, focal weakness, leg pain, myalgias or shortness of breath. Current antihyperlipidemic treatment includes diet change. Compliance problems include psychosocial issues.  Risk factors for coronary artery disease include hypertension, male sex and obesity.      Review of Systems  Constitutional: Negative.  Negative for fever, chills, diaphoresis, activity change, appetite change, fatigue and unexpected weight change.  HENT: Negative.   Eyes: Negative.   Respiratory: Negative.  Negative for cough, chest tightness, shortness of breath, wheezing and stridor.   Cardiovascular: Negative.  Negative for chest pain, palpitations and leg swelling.  Gastrointestinal: Negative.  Negative for nausea, vomiting, abdominal pain, diarrhea and constipation.  Endocrine: Negative.   Genitourinary: Negative.   Musculoskeletal: Negative.  Negative for myalgias, back pain, joint swelling, arthralgias and gait problem.  Skin: Negative.   Allergic/Immunologic: Negative.   Neurological: Negative.  Negative for dizziness and focal weakness.  Hematological: Negative.  Negative for adenopathy. Does not bruise/bleed easily.  Psychiatric/Behavioral: Negative.        Objective:   Physical Exam  Vitals reviewed. Constitutional: He is oriented to person, place, and time. He appears well-developed and well-nourished. No distress.  HENT:  Head: Normocephalic and atraumatic.  Mouth/Throat:  Oropharynx is clear and moist. No oropharyngeal exudate.  Eyes: Conjunctivae are normal. Right eye exhibits no discharge. Left eye exhibits no discharge. No scleral icterus.  Neck: Normal range of motion. Neck supple. No JVD present. No tracheal deviation present. No thyromegaly present.  Cardiovascular: Normal rate, regular rhythm, normal heart sounds and intact distal pulses.  Exam reveals no gallop and no friction rub.   No murmur heard. Pulmonary/Chest: Effort normal and breath sounds normal. No stridor. No respiratory distress. He has no wheezes. He has no rales. He exhibits no tenderness.  Abdominal: Soft. Bowel sounds are normal. He exhibits no distension and no mass. There is no tenderness. There is no rebound and no guarding.  Musculoskeletal: Normal range of motion. He exhibits no edema and no tenderness.  Lymphadenopathy:    He has no cervical adenopathy.  Neurological: He is oriented to person, place, and time.  Skin: Skin is warm, dry and intact. Lesion noted. No abrasion, no bruising, no burn, no ecchymosis, no laceration, no petechiae, no purpura and no rash noted. Rash is not macular, not papular, not maculopapular, not nodular, not pustular, not vesicular and not urticarial. He is not diaphoretic. No cyanosis or erythema. No pallor. Nails show no clubbing.  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  I applied cryotherapy to each lesion, 10 seconds of freeze and thaw for 3 cycles, he tolerated it well.  Psychiatric: He has a normal mood and affect. His behavior is normal. Judgment and thought content normal.     Lab Results  Component Value Date   WBC 4.8 11/04/2011   HGB 14.4 11/04/2011   HCT 42.8 11/04/2011   PLT 216.0 11/04/2011   GLUCOSE 103* 11/04/2011   CHOL 185 11/04/2011   TRIG 151.0* 11/04/2011  HDL 42.70 11/04/2011   LDLDIRECT 128.8 10/11/2009   LDLCALC 112* 11/04/2011   ALT 15 11/04/2011   AST 24 11/04/2011   NA 139 11/04/2011   K 4.0 11/04/2011   CL  104 11/04/2011   CREATININE 0.9 11/04/2011   BUN 9 11/04/2011   CO2 28 11/04/2011   TSH 2.94 11/04/2011   PSA 0.56 10/11/2009   INR 1.0 ratio 09/06/2009       Assessment & Plan:

## 2012-11-09 NOTE — Patient Instructions (Signed)
Hypertension As your heart beats, it forces blood through your arteries. This force is your blood pressure. If the pressure is too high, it is called hypertension (HTN) or high blood pressure. HTN is dangerous because you may have it and not know it. High blood pressure may mean that your heart has to work harder to pump blood. Your arteries may be narrow or stiff. The extra work puts you at risk for heart disease, stroke, and other problems.  Blood pressure consists of two numbers, a higher number over a lower, 110/72, for example. It is stated as "110 over 72." The ideal is below 120 for the top number (systolic) and under 80 for the bottom (diastolic). Write down your blood pressure today. You should pay close attention to your blood pressure if you have certain conditions such as:  Heart failure.  Prior heart attack.  Diabetes  Chronic kidney disease.  Prior stroke.  Multiple risk factors for heart disease. To see if you have HTN, your blood pressure should be measured while you are seated with your arm held at the level of the heart. It should be measured at least twice. A one-time elevated blood pressure reading (especially in the Emergency Department) does not mean that you need treatment. There may be conditions in which the blood pressure is different between your right and left arms. It is important to see your caregiver soon for a recheck. Most people have essential hypertension which means that there is not a specific cause. This type of high blood pressure may be lowered by changing lifestyle factors such as:  Stress.  Smoking.  Lack of exercise.  Excessive weight.  Drug/tobacco/alcohol use.  Eating less salt. Most people do not have symptoms from high blood pressure until it has caused damage to the body. Effective treatment can often prevent, delay or reduce that damage. TREATMENT  When a cause has been identified, treatment for high blood pressure is directed at the  cause. There are a large number of medications to treat HTN. These fall into several categories, and your caregiver will help you select the medicines that are best for you. Medications may have side effects. You should review side effects with your caregiver. If your blood pressure stays high after you have made lifestyle changes or started on medicines,   Your medication(s) may need to be changed.  Other problems may need to be addressed.  Be certain you understand your prescriptions, and know how and when to take your medicine.  Be sure to follow up with your caregiver within the time frame advised (usually within two weeks) to have your blood pressure rechecked and to review your medications.  If you are taking more than one medicine to lower your blood pressure, make sure you know how and at what times they should be taken. Taking two medicines at the same time can result in blood pressure that is too low. SEEK IMMEDIATE MEDICAL CARE IF:  You develop a severe headache, blurred or changing vision, or confusion.  You have unusual weakness or numbness, or a faint feeling.  You have severe chest or abdominal pain, vomiting, or breathing problems. MAKE SURE YOU:   Understand these instructions.  Will watch your condition.  Will get help right away if you are not doing well or get worse. Document Released: 04/15/2005 Document Revised: 07/08/2011 Document Reviewed: 12/04/2007 [employer] Patient Information 2014 [employer]. Health Maintenance, Males A healthy lifestyle and preventative care can promote health and wellness.  Maintain   regular health, dental, and eye exams.  Eat a healthy diet. Foods like vegetables, fruits, whole grains, low-fat dairy products, and lean protein foods contain the nutrients you need without too many calories. Decrease your intake of foods high in solid fats, added sugars, and salt. Get information about a proper diet from your caregiver, if  necessary.  Regular physical exercise is one of the most important things you can do for your health. Most adults should get at least 150 minutes of moderate-intensity exercise (any activity that increases your heart rate and causes you to sweat) each week. In addition, most adults need muscle-strengthening exercises on 2 or more days a week.   Maintain a healthy weight. The body mass index (BMI) is a screening tool to identify possible weight problems. It provides an estimate of body fat based on height and weight. Your caregiver can help determine your BMI, and can help you achieve or maintain a healthy weight. For adults 20 years and older:  A BMI below 18.5 is considered underweight.  A BMI of 18.5 to 24.9 is normal.  A BMI of 25 to 29.9 is considered overweight.  A BMI of 30 and above is considered obese.  Maintain normal blood lipids and cholesterol by exercising and minimizing your intake of saturated fat. Eat a balanced diet with plenty of fruits and vegetables. Blood tests for lipids and cholesterol should begin at age 20 and be repeated every 5 years. If your lipid or cholesterol levels are high, you are over 50, or you are a high risk for heart disease, you may need your cholesterol levels checked more frequently.Ongoing high lipid and cholesterol levels should be treated with medicines, if diet and exercise are not effective.  If you smoke, find out from your caregiver how to quit. If you do not use tobacco, do not start.  If you choose to drink alcohol, do not exceed 2 drinks per day. One drink is considered to be 12 ounces (355 mL) of beer, 5 ounces (148 mL) of wine, or 1.5 ounces (44 mL) of liquor.  Avoid use of street drugs. Do not share needles with anyone. Ask for help if you need support or instructions about stopping the use of drugs.  High blood pressure causes heart disease and increases the risk of stroke. Blood pressure should be checked at least every 1 to 2 years.  Ongoing high blood pressure should be treated with medicines if weight loss and exercise are not effective.  If you are 45 to 71 years old, ask your caregiver if you should take aspirin to prevent heart disease.  Diabetes screening involves taking a blood sample to check your fasting blood sugar level. This should be done once every 3 years, after age 45, if you are within normal weight and without risk factors for diabetes. Testing should be considered at a younger age or be carried out more frequently if you are overweight and have at least 1 risk factor for diabetes.  Colorectal cancer can be detected and often prevented. Most routine colorectal cancer screening begins at the age of 50 and continues through age 75. However, your caregiver may recommend screening at an earlier age if you have risk factors for colon cancer. On a yearly basis, your caregiver may provide home test kits to check for hidden blood in the stool. Use of a small camera at the end of a tube, to directly examine the colon (sigmoidoscopy or colonoscopy), can detect the earliest forms of colorectal   cancer. Talk to your caregiver about this at age 50, when routine screening begins. Direct examination of the colon should be repeated every 5 to 10 years through age 75, unless early forms of pre-cancerous polyps or small growths are found.  Hepatitis C blood testing is recommended for all people born from 1945 through 1965 and any individual with known risks for hepatitis C.  Healthy men should no longer receive prostate-specific antigen (PSA) blood tests as part of routine cancer screening. Consult with your caregiver about prostate cancer screening.  Testicular cancer screening is not recommended for adolescents or adult males who have no symptoms. Screening includes self-exam, caregiver exam, and other screening tests. Consult with your caregiver about any symptoms you have or any concerns you have about testicular  cancer.  Practice safe sex. Use condoms and avoid high-risk sexual practices to reduce the spread of sexually transmitted infections (STIs).  Use sunscreen with a sun protection factor (SPF) of 30 or greater. Apply sunscreen liberally and repeatedly throughout the day. You should seek shade when your shadow is shorter than you. Protect yourself by wearing long sleeves, pants, a wide-brimmed hat, and sunglasses year round, whenever you are outdoors.  Notify your caregiver of new moles or changes in moles, especially if there is a change in shape or color. Also notify your caregiver if a mole is larger than the size of a pencil eraser.  A one-time screening for abdominal aortic aneurysm (AAA) and surgical repair of large AAAs by sound wave imaging (ultrasonography) is recommended for ages 65 to 75 years who are current or former smokers.  Stay current with your immunizations. Document Released: 10/12/2007 Document Revised: 07/08/2011 Document Reviewed: 09/10/2010 [employer] Patient Information 2014 [employer].  

## 2012-11-09 NOTE — Assessment & Plan Note (Addendum)

## 2012-11-09 NOTE — Assessment & Plan Note (Signed)
He will not take a statin 

## 2012-12-14 ENCOUNTER — Other Ambulatory Visit: Payer: Self-pay | Admitting: Dermatology

## 2012-12-14 DIAGNOSIS — L851 Acquired keratosis [keratoderma] palmaris et plantaris: Secondary | ICD-10-CM | POA: Diagnosis not present

## 2012-12-14 DIAGNOSIS — L570 Actinic keratosis: Secondary | ICD-10-CM | POA: Diagnosis not present

## 2012-12-14 DIAGNOSIS — L821 Other seborrheic keratosis: Secondary | ICD-10-CM | POA: Diagnosis not present

## 2012-12-14 DIAGNOSIS — Z85828 Personal history of other malignant neoplasm of skin: Secondary | ICD-10-CM | POA: Diagnosis not present

## 2012-12-14 DIAGNOSIS — D239 Other benign neoplasm of skin, unspecified: Secondary | ICD-10-CM | POA: Diagnosis not present

## 2012-12-14 DIAGNOSIS — D485 Neoplasm of uncertain behavior of skin: Secondary | ICD-10-CM | POA: Diagnosis not present

## 2013-02-12 ENCOUNTER — Encounter: Payer: Self-pay | Admitting: Internal Medicine

## 2013-02-12 ENCOUNTER — Ambulatory Visit (INDEPENDENT_AMBULATORY_CARE_PROVIDER_SITE_OTHER): Payer: Medicare Other | Admitting: Internal Medicine

## 2013-02-12 VITALS — BP 142/98 | HR 75 | Temp 97.0°F | Ht 75.0 in | Wt 211.0 lb

## 2013-02-12 DIAGNOSIS — J309 Allergic rhinitis, unspecified: Secondary | ICD-10-CM

## 2013-02-12 DIAGNOSIS — H8110 Benign paroxysmal vertigo, unspecified ear: Secondary | ICD-10-CM | POA: Diagnosis not present

## 2013-02-12 DIAGNOSIS — J302 Other seasonal allergic rhinitis: Secondary | ICD-10-CM

## 2013-02-12 MED ORDER — MECLIZINE HCL 12.5 MG PO TABS: 12.5000 mg | ORAL_TABLET | Freq: Three times a day (TID) | ORAL | Status: DC | PRN

## 2013-02-12 NOTE — Progress Notes (Signed)
  Subjective:    Patient ID: Patrick [name], male    DOB: Aug 27, 1940, 72 y.o.   MRN: [id number]  HPI complains of dizziness Onset 1 week ago, intermittent symptoms occuring 2-3x/day symptoms positional - rolling in bed, turning head or sitting to standing change No headache or vision changes symptoms last 1-4 minutes, then resolve spontaneously Increase in allergy symptoms in past 2 weeks - using claritin for same -   Past Medical History  Diagnosis Date  . Atrial tachycardia   . Rheumatic heart disease     72yrs old  . Diverticulosis   . Hemorrhoids   . Blood transfusion 1944  . Hyperlipemia   . Prostate cancer     Gleason 6 status post radiation seed implant September 2010     Review of Systems  Constitutional: Negative for fever, fatigue and unexpected weight change.  HENT: Positive for postnasal drip, rhinorrhea and sinus pressure. Negative for ear discharge, ear pain (occ "pressure" and "underwater" sensation w/ allergy flare), sneezing, sore throat and tinnitus.   Eyes: Negative for itching and visual disturbance.  Respiratory: Negative for cough and shortness of breath.   Cardiovascular: Negative for chest pain and palpitations.  Neurological: Positive for dizziness. Negative for tremors, seizures, syncope, facial asymmetry, speech difficulty, weakness, light-headedness, numbness and headaches.       Objective:   Physical Exam BP 142/98  Pulse 75  Temp(Src) 97 F (36.1 C) (Oral)  Ht 6\' 3"  (1.905 m)  Wt 211 lb (95.709 kg)  BMI 26.37 kg/m2  SpO2 97% Wt Readings from Last 3 Encounters:  02/12/13 211 lb (95.709 kg)  11/09/12 210 lb 12 oz (95.596 kg)  03/23/12 212 lb (96.163 kg)   Constitutional: he appears well-developed and well-nourished. No distress.  HENT: Head: Normocephalic and atraumatic. Ears: B TMs ok, no erythema or effusion; Nose: clear rhinnorhea without turbinate swelling or erythema, mild pallor. Mouth/Throat: Oropharynx is clear and moist. No  oropharyngeal exudate.  Eyes: Conjunctivae and EOM are normal. Pupils are equal, round, and reactive to light. No scleral icterus.  Neck: Normal range of motion. Neck supple. No JVD present. No thyromegaly present.  Cardiovascular: Normal rate, regular rhythm and normal heart sounds.  No murmur heard. No BLE edema. Pulmonary/Chest: Effort normal and breath sounds normal. No respiratory distress. he has no wheezes.  Neurological: he is alert and oriented to person, place, and time. No cranial nerve deficit. Coordination, balance, strength, speech and gait are normal.  Psychiatric: he has a normal mood and affect. behavior is normal. Judgment and thought content normal.  Lab Results  Component Value Date   WBC 4.9 11/09/2012   HGB 14.4 11/09/2012   HCT 42.2 11/09/2012   PLT 214.0 11/09/2012   GLUCOSE 98 11/09/2012   CHOL 185 11/09/2012   TRIG 176.0* 11/09/2012   HDL 38.80* 11/09/2012   LDLDIRECT 128.8 10/11/2009   LDLCALC 111* 11/09/2012   ALT 18 11/09/2012   AST 21 11/09/2012   NA 139 11/09/2012   K 4.4 11/09/2012   CL 104 11/09/2012   CREATININE 1.2 11/09/2012   BUN 16 11/09/2012   CO2 29 11/09/2012   TSH 2.43 11/09/2012   PSA 0.56 10/11/2009   INR 1.0 ratio 09/06/2009   HGBA1C 5.6 11/09/2012       Assessment & Plan:   BPPV - ENT and neuro exam benign Education provided Meclizine tid x 72h, then prn - erx done  allergic rhinitis - ok to continue claritin as needed

## 2013-02-12 NOTE — Patient Instructions (Addendum)
It was good to see you today.  We have reviewed your prior records including labs and tests today  Use meclizine 3 times daily for the next 48 hours to treat dizziness, then as needed Your prescription(s) have been submitted to your pharmacy. Please take as directed and contact our office if you believe you are having problem(s) with the medication(s).  Other medications reviewed and updated. Okay to continue Claritin daily as needed for seasonal allergy symptoms  Call if symptoms unimproved in the next one to 2 weeks, sooner if worse  Benign Positional Vertigo Vertigo means you feel like you or your surroundings are moving when they are not. Benign positional vertigo is the most common form of vertigo. Benign means that the cause of your condition is not serious. Benign positional vertigo is more common in older adults. CAUSES  Benign positional vertigo is the result of an upset in the labyrinth system. This is an area in the middle ear that helps control your balance. This may be caused by a viral infection, head injury, or repetitive motion. However, often no specific cause is found. SYMPTOMS  Symptoms of benign positional vertigo occur when you move your head or eyes in different directions. Some of the symptoms may include:  Loss of balance and falls.  Vomiting.  Blurred vision.  Dizziness.  Nausea.  Involuntary eye movements (nystagmus). DIAGNOSIS  Benign positional vertigo is usually diagnosed by physical exam. If the specific cause of your benign positional vertigo is unknown, your caregiver may perform imaging tests, such as magnetic resonance imaging (MRI) or computed tomography (CT). TREATMENT  Your caregiver may recommend movements or procedures to correct the benign positional vertigo. Medicines such as meclizine, benzodiazepines, and medicines for nausea may be used to treat your symptoms. In rare cases, if your symptoms are caused by certain conditions that affect the  inner ear, you may need surgery. HOME CARE INSTRUCTIONS   Follow your caregiver's instructions.  Move slowly. Do not make sudden body or head movements.  Avoid driving.  Avoid operating heavy machinery.  Avoid performing any tasks that would be dangerous to you or others during a vertigo episode.  Drink enough fluids to keep your urine clear or pale yellow. SEEK IMMEDIATE MEDICAL CARE IF:   You develop problems with walking, weakness, numbness, or using your arms, hands, or legs.  You have difficulty speaking.  You develop severe headaches.  Your nausea or vomiting continues or gets worse.  You develop visual changes.  Your family or friends notice any behavioral changes.  Your condition gets worse.  You have a fever.  You develop a stiff neck or sensitivity to light. MAKE SURE YOU:   Understand these instructions.  Will watch your condition.  Will get help right away if you are not doing well or get worse. Document Released: 01/21/2006 Document Revised: 07/08/2011 Document Reviewed: 01/03/2011 [facility] Patient Information 2014 [place], [place].

## 2013-02-13 NOTE — Assessment & Plan Note (Signed)
Ok to continue claritin prn as it is unlikely med contributing to bppv symptoms

## 2013-02-27 DIAGNOSIS — Z23 Encounter for immunization: Secondary | ICD-10-CM | POA: Diagnosis not present

## 2013-03-08 DIAGNOSIS — H2510 Age-related nuclear cataract, unspecified eye: Secondary | ICD-10-CM | POA: Diagnosis not present

## 2013-04-12 DIAGNOSIS — Z1331 Encounter for screening for depression: Secondary | ICD-10-CM | POA: Diagnosis not present

## 2013-04-12 DIAGNOSIS — Z8546 Personal history of malignant neoplasm of prostate: Secondary | ICD-10-CM | POA: Diagnosis not present

## 2013-04-12 DIAGNOSIS — Z23 Encounter for immunization: Secondary | ICD-10-CM | POA: Diagnosis not present

## 2013-04-12 DIAGNOSIS — I498 Other specified cardiac arrhythmias: Secondary | ICD-10-CM | POA: Diagnosis not present

## 2013-04-12 DIAGNOSIS — Z6827 Body mass index (BMI) 27.0-27.9, adult: Secondary | ICD-10-CM | POA: Diagnosis not present

## 2013-04-12 DIAGNOSIS — I951 Orthostatic hypotension: Secondary | ICD-10-CM | POA: Diagnosis not present

## 2013-04-12 DIAGNOSIS — J309 Allergic rhinitis, unspecified: Secondary | ICD-10-CM | POA: Diagnosis not present

## 2013-05-10 ENCOUNTER — Encounter: Payer: Self-pay | Admitting: Internal Medicine

## 2013-05-10 ENCOUNTER — Ambulatory Visit (INDEPENDENT_AMBULATORY_CARE_PROVIDER_SITE_OTHER): Payer: Medicare Other | Admitting: Internal Medicine

## 2013-05-10 VITALS — BP 128/88 | HR 82 | Ht 75.0 in | Wt 214.0 lb

## 2013-05-10 DIAGNOSIS — J309 Allergic rhinitis, unspecified: Secondary | ICD-10-CM | POA: Diagnosis not present

## 2013-05-10 DIAGNOSIS — J3089 Other allergic rhinitis: Principal | ICD-10-CM

## 2013-05-10 DIAGNOSIS — J302 Other seasonal allergic rhinitis: Secondary | ICD-10-CM

## 2013-05-10 MED ORDER — PHENYLEPHRINE HCL 1 % NA SOLN
3.0000 [drp] | Freq: Once | NASAL | Status: AC
  Administered 2013-05-10: 3 [drp] via NASAL

## 2013-05-10 MED ORDER — PHENYLEPHRINE HCL 1 % NA SOLN: 1.0000 [drp] | Freq: Four times a day (QID) | NASAL | Status: DC | PRN

## 2013-05-10 MED ORDER — METHYLPREDNISOLONE ACETATE 80 MG/ML IJ SUSP
80.0000 mg | Freq: Once | INTRAMUSCULAR | Status: AC
  Administered 2013-05-10: 80 mg via INTRAMUSCULAR

## 2013-05-10 NOTE — Progress Notes (Signed)
11/28/11- 28 yoM never smoker referred courtesy of Dr Ronnald Ramp for allergy evaluation concerned about facial swelling 17,2013-? pollen attacks causing it? The first episode was about a year ago. Episodes have begun with lip swelling. Usually he gets left orbital edema noticed on waking in the morning. Episodes last 3 or 4 days then resolve. They're more common when he has been outside such as golfing or yard work. Most recent was 10/14/2011. He takes Zyrtec if he thinks he needs it as a preventative. He has a long history of nasal congestion and perennial stuffiness with drainage but has not considered himself significantly allergic. There is no history of cough, wheeze or asthma. Swelling episodes seemed better after he stopped taking aspirin last winter. Father had history of sinus disease without urticaria. Medical history significant for prostate seed therapy for cancer 3 years ago. Tonsillectomy. Hx SVT/ ablation. CT max fac- 05/15/11 IMPRESSION:  Asymmetric left malar and left supraorbital facial swelling with  some type of process replacing the subcutaneous fat. Cellulitis ng.  would be one consideration. An infiltrative dermatologic neoplasm  is not excluded. There is no underlying osseous destruction or  secondary reaction from paranasal sinus disease.  Original Report Authenticated By: Staci Righter, M.D.    01/13/12-71 yoM never smoker referred courtesy of Dr Ronnald Ramp for allergy evaluation concerned about facial swelling -? pollen attacks causing it? Still has not taken ASA since January - he had blamed aspirin-; no flare ups since 09-2011. He has been out doors a lot suspected of being a trigger. Occasional left frontal headache he is outdoors a lot. No history of seasonal rhinitis. Allergy Profile 12/03/2011-total IgE 34.6 with no specific elevations  03/23/12- 72 yoM never smoker referred courtesy of Dr Ronnald Ramp for allergy evaluation concerned about facial swelling -? pollen attacks causing  it? Follows For:  Feeling better - Denies facial swelling - Hoarseness since last week - Sinus congestion There been no more facial swelling episodes since last here. He has an incidental, unremarkable cold which is getting better just using saline nasal spray. He has avoided aspirin products out of suspicion that they might be a trigger. CT maxillofacial- reviewed with him. IMPRESSION:  Paranasal sinuses clear.  Original Report Authenticated By: Raelyn Number, M.D.   05/10/13- 14 yoM never smoker referred courtesy of Dr Ronnald Ramp for allergy evaluation concerned about facial swelling -? pollen attacks causing it?  Woman here? wife ACUTE VISIT: swelling above eye after taking Loratadine x 3 days in a row instead of as needed. Stopped up sinus issues-more than usual. Recent nasal congestion. Little mucus. Aware of deviated septum. Doing saline rinse.  ROS-see HPI Constitutional:   No-   weight loss, night sweats, fevers, chills, fatigue, lassitude. HEENT:   No-  headaches, difficulty swallowing, tooth/dental problems, sore throat,       No-  sneezing, itching, ear ache,  +nasal congestion, post nasal drip,  CV:  No-   chest pain, orthopnea, PND, swelling in lower extremities, anasarca, dizziness, palpitations Resp: No-   shortness of breath with exertion or at rest.              No-   productive cough,  No non-productive cough,  No- coughing up of blood.              No-   change in color of mucus.  No- wheezing.   Skin: See history of present illness GI:  No-   heartburn, indigestion, abdominal pain, nausea, vomiting,  GU:  MS:  No-   joint pain or swelling.  Neuro-     nothing unusual Psych:  No- change in mood or affect. No depression or anxiety.  No memory loss.  OBJ- Physical Exam General- Alert, Oriented, Affect-appropriate, Distress- none acute Skin- rash-none, lesions- none, excoriation- none Lymphadenopathy- none Head- atraumatic            Eyes- Gross vision intact, PERRLA,  conjunctivae and secretions clear            Ears- Hearing, canals-normal            Nose- + sniffing and nasal stuffiness, +Septal dev- narrow on left, mucus, polyps, erosion, perforation             Throat- Mallampati II , mucosa clear , drainage- none, tonsils- atrophic Neck- flexible , trachea midline, no stridor , thyroid nl, carotid no bruit Chest - symmetrical excursion , unlabored           Heart/CV- RRR , no murmur , no gallop  , no rub, nl s1 s2                           - JVD- none , edema- none, stasis changes- none, varices- none           Lung- clear to P&A, wheeze- none, cough- none , dullness-none, rub- none           Chest wall-  Abd- Br/ Gen/ Rectal- Not done, not indicated Extrem- cyanosis- none, clubbing, none, atrophy- none, strength- nl Neuro- grossly intact to observation

## 2013-05-10 NOTE — Patient Instructions (Signed)
Neb neo nasal  Depo 51  Ok to use artificial tears and nasal saline spray as needed  For nasal congestion you could try an otc decongestant like Sudafed-PE

## 2013-05-11 DIAGNOSIS — H2510 Age-related nuclear cataract, unspecified eye: Secondary | ICD-10-CM | POA: Diagnosis not present

## 2013-05-28 DIAGNOSIS — H2510 Age-related nuclear cataract, unspecified eye: Secondary | ICD-10-CM | POA: Diagnosis not present

## 2013-05-28 DIAGNOSIS — H269 Unspecified cataract: Secondary | ICD-10-CM | POA: Diagnosis not present

## 2013-06-02 NOTE — Assessment & Plan Note (Signed)
Persistent nasal congestion since the fall, lasting longer than usual this year. Plan-try Nasal Decongestant nebulizer, Depo-Medrol, Sudafed PE. He may need ENT referral.

## 2013-06-04 DIAGNOSIS — H2510 Age-related nuclear cataract, unspecified eye: Secondary | ICD-10-CM | POA: Diagnosis not present

## 2013-06-28 DIAGNOSIS — H2510 Age-related nuclear cataract, unspecified eye: Secondary | ICD-10-CM | POA: Diagnosis not present

## 2013-07-19 DIAGNOSIS — Z8546 Personal history of malignant neoplasm of prostate: Secondary | ICD-10-CM | POA: Diagnosis not present

## 2013-07-26 DIAGNOSIS — R351 Nocturia: Secondary | ICD-10-CM | POA: Diagnosis not present

## 2013-07-26 DIAGNOSIS — N529 Male erectile dysfunction, unspecified: Secondary | ICD-10-CM | POA: Diagnosis not present

## 2013-07-26 DIAGNOSIS — Z8546 Personal history of malignant neoplasm of prostate: Secondary | ICD-10-CM | POA: Diagnosis not present

## 2013-07-26 DIAGNOSIS — R3129 Other microscopic hematuria: Secondary | ICD-10-CM | POA: Diagnosis not present

## 2013-08-18 DIAGNOSIS — H35419 Lattice degeneration of retina, unspecified eye: Secondary | ICD-10-CM | POA: Diagnosis not present

## 2013-08-20 DIAGNOSIS — C61 Malignant neoplasm of prostate: Secondary | ICD-10-CM | POA: Diagnosis not present

## 2013-08-20 DIAGNOSIS — R3129 Other microscopic hematuria: Secondary | ICD-10-CM | POA: Diagnosis not present

## 2013-08-20 DIAGNOSIS — N200 Calculus of kidney: Secondary | ICD-10-CM | POA: Diagnosis not present

## 2013-09-13 DIAGNOSIS — N3040 Irradiation cystitis without hematuria: Secondary | ICD-10-CM | POA: Diagnosis not present

## 2013-09-13 DIAGNOSIS — Z8546 Personal history of malignant neoplasm of prostate: Secondary | ICD-10-CM | POA: Diagnosis not present

## 2013-09-13 DIAGNOSIS — R3129 Other microscopic hematuria: Secondary | ICD-10-CM | POA: Diagnosis not present

## 2013-09-13 DIAGNOSIS — N200 Calculus of kidney: Secondary | ICD-10-CM | POA: Diagnosis not present

## 2013-09-21 ENCOUNTER — Telehealth: Payer: Self-pay | Admitting: Internal Medicine

## 2013-09-21 NOTE — Telephone Encounter (Signed)
Rec'd from [facility] forward 6 pages to [place]

## 2013-10-25 DIAGNOSIS — H8110 Benign paroxysmal vertigo, unspecified ear: Secondary | ICD-10-CM | POA: Diagnosis not present

## 2013-10-25 DIAGNOSIS — Z6827 Body mass index (BMI) 27.0-27.9, adult: Secondary | ICD-10-CM | POA: Diagnosis not present

## 2013-10-25 DIAGNOSIS — J309 Allergic rhinitis, unspecified: Secondary | ICD-10-CM | POA: Diagnosis not present

## 2013-11-29 DIAGNOSIS — R82998 Other abnormal findings in urine: Secondary | ICD-10-CM | POA: Diagnosis not present

## 2013-11-29 DIAGNOSIS — Z0000 Encounter for general adult medical examination without abnormal findings: Secondary | ICD-10-CM | POA: Diagnosis not present

## 2013-11-29 DIAGNOSIS — N200 Calculus of kidney: Secondary | ICD-10-CM | POA: Diagnosis not present

## 2013-11-29 DIAGNOSIS — Z125 Encounter for screening for malignant neoplasm of prostate: Secondary | ICD-10-CM | POA: Diagnosis not present

## 2013-11-29 DIAGNOSIS — E785 Hyperlipidemia, unspecified: Secondary | ICD-10-CM | POA: Diagnosis not present

## 2013-11-29 DIAGNOSIS — I498 Other specified cardiac arrhythmias: Secondary | ICD-10-CM | POA: Diagnosis not present

## 2013-12-06 DIAGNOSIS — I498 Other specified cardiac arrhythmias: Secondary | ICD-10-CM | POA: Diagnosis not present

## 2013-12-06 DIAGNOSIS — Z0000 Encounter for general adult medical examination without abnormal findings: Secondary | ICD-10-CM | POA: Diagnosis not present

## 2013-12-06 DIAGNOSIS — Z8546 Personal history of malignant neoplasm of prostate: Secondary | ICD-10-CM | POA: Diagnosis not present

## 2013-12-06 DIAGNOSIS — J309 Allergic rhinitis, unspecified: Secondary | ICD-10-CM | POA: Diagnosis not present

## 2013-12-06 DIAGNOSIS — H8110 Benign paroxysmal vertigo, unspecified ear: Secondary | ICD-10-CM | POA: Diagnosis not present

## 2013-12-06 DIAGNOSIS — R3129 Other microscopic hematuria: Secondary | ICD-10-CM | POA: Diagnosis not present

## 2013-12-06 DIAGNOSIS — I951 Orthostatic hypotension: Secondary | ICD-10-CM | POA: Diagnosis not present

## 2013-12-06 DIAGNOSIS — N200 Calculus of kidney: Secondary | ICD-10-CM | POA: Diagnosis not present

## 2013-12-07 DIAGNOSIS — Z1212 Encounter for screening for malignant neoplasm of rectum: Secondary | ICD-10-CM | POA: Diagnosis not present

## 2013-12-27 DIAGNOSIS — L919 Hypertrophic disorder of the skin, unspecified: Secondary | ICD-10-CM | POA: Diagnosis not present

## 2013-12-27 DIAGNOSIS — L570 Actinic keratosis: Secondary | ICD-10-CM | POA: Diagnosis not present

## 2013-12-27 DIAGNOSIS — L821 Other seborrheic keratosis: Secondary | ICD-10-CM | POA: Diagnosis not present

## 2013-12-27 DIAGNOSIS — L909 Atrophic disorder of skin, unspecified: Secondary | ICD-10-CM | POA: Diagnosis not present

## 2013-12-27 DIAGNOSIS — D239 Other benign neoplasm of skin, unspecified: Secondary | ICD-10-CM | POA: Diagnosis not present

## 2013-12-27 DIAGNOSIS — Z85828 Personal history of other malignant neoplasm of skin: Secondary | ICD-10-CM | POA: Diagnosis not present

## 2014-02-27 DIAGNOSIS — Z23 Encounter for immunization: Secondary | ICD-10-CM | POA: Diagnosis not present

## 2014-03-21 DIAGNOSIS — Z8546 Personal history of malignant neoplasm of prostate: Secondary | ICD-10-CM | POA: Diagnosis not present

## 2014-03-21 DIAGNOSIS — R351 Nocturia: Secondary | ICD-10-CM | POA: Diagnosis not present

## 2014-03-21 DIAGNOSIS — R312 Other microscopic hematuria: Secondary | ICD-10-CM | POA: Diagnosis not present

## 2014-05-23 DIAGNOSIS — H33322 Round hole, left eye: Secondary | ICD-10-CM | POA: Diagnosis not present

## 2014-05-23 DIAGNOSIS — H35412 Lattice degeneration of retina, left eye: Secondary | ICD-10-CM | POA: Diagnosis not present

## 2014-06-10 DIAGNOSIS — H35373 Puckering of macula, bilateral: Secondary | ICD-10-CM | POA: Diagnosis not present

## 2014-06-10 DIAGNOSIS — H35413 Lattice degeneration of retina, bilateral: Secondary | ICD-10-CM | POA: Diagnosis not present

## 2014-06-10 DIAGNOSIS — H33313 Horseshoe tear of retina without detachment, bilateral: Secondary | ICD-10-CM | POA: Diagnosis not present

## 2014-06-10 DIAGNOSIS — H33321 Round hole, right eye: Secondary | ICD-10-CM | POA: Diagnosis not present

## 2014-06-20 ENCOUNTER — Encounter: Payer: Self-pay | Admitting: Internal Medicine

## 2014-06-20 ENCOUNTER — Encounter (INDEPENDENT_AMBULATORY_CARE_PROVIDER_SITE_OTHER): Payer: Self-pay

## 2014-06-20 ENCOUNTER — Ambulatory Visit (INDEPENDENT_AMBULATORY_CARE_PROVIDER_SITE_OTHER): Payer: Medicare Other | Admitting: Internal Medicine

## 2014-06-20 VITALS — BP 134/88 | HR 87 | Wt 218.0 lb

## 2014-06-20 DIAGNOSIS — J0101 Acute recurrent maxillary sinusitis: Secondary | ICD-10-CM

## 2014-06-20 DIAGNOSIS — Z888 Allergy status to other drugs, medicaments and biological substances status: Secondary | ICD-10-CM

## 2014-06-20 DIAGNOSIS — Z889 Allergy status to unspecified drugs, medicaments and biological substances status: Secondary | ICD-10-CM

## 2014-06-20 MED ORDER — AZITHROMYCIN 250 MG PO TABS: ORAL_TABLET | ORAL | Status: DC

## 2014-06-20 NOTE — Patient Instructions (Addendum)
Script sent for Zpak antibiotic  Neb neo nasal  Ok to use the sudafed and claritin when needed  Please call if we can help

## 2014-06-20 NOTE — Progress Notes (Signed)
11/28/11- 5 yoM never smoker referred courtesy of Dr Ronnald Ramp for allergy evaluation concerned about facial swelling 17,2013-? pollen attacks causing it? The first episode was about a year ago. Episodes have begun with lip swelling. Usually he gets left orbital edema noticed on waking in the morning. Episodes last 3 or 4 days then resolve. They're more common when he has been outside such as golfing or yard work. Most recent was 10/14/2011. He takes Zyrtec if he thinks he needs it as a preventative. He has a long history of nasal congestion and perennial stuffiness with drainage but has not considered himself significantly allergic. There is no history of cough, wheeze or asthma. Swelling episodes seemed better after he stopped taking aspirin last winter. Father had history of sinus disease without urticaria. Medical history significant for prostate seed therapy for cancer 3 years ago. Tonsillectomy. Hx SVT/ ablation. CT max fac- 05/15/11 IMPRESSION:  Asymmetric left malar and left supraorbital facial swelling with  some type of process replacing the subcutaneous fat. Cellulitis ng.  would be one consideration. An infiltrative dermatologic neoplasm  is not excluded. There is no underlying osseous destruction or  secondary reaction from paranasal sinus disease.  Original Report Authenticated By: Staci Righter, M.D.    01/13/12-71 yoM never smoker referred courtesy of Dr Ronnald Ramp for allergy evaluation concerned about facial swelling -? pollen attacks causing it? Still has not taken ASA since January - he had blamed aspirin-; no flare ups since 09-2011. He has been out doors a lot suspected of being a trigger. Occasional left frontal headache he is outdoors a lot. No history of seasonal rhinitis. Allergy Profile 12/03/2011-total IgE 34.6 with no specific elevations  03/23/12- 41 yoM never smoker referred courtesy of Dr Ronnald Ramp for allergy evaluation concerned about facial swelling -? pollen attacks causing  it? Follows For:  Feeling better - Denies facial swelling - Hoarseness since last week - Sinus congestion There been no more facial swelling episodes since last here. He has an incidental, unremarkable cold which is getting better just using saline nasal spray. He has avoided aspirin products out of suspicion that they might be a trigger. CT maxillofacial- reviewed with him. IMPRESSION:  Paranasal sinuses clear.  Original Report Authenticated By: Raelyn Number, M.D.   05/10/13- 34 yoM never smoker referred courtesy of Dr Ronnald Ramp for allergy evaluation concerned about facial swelling -? pollen attacks causing it?  Woman here? wife ACUTE VISIT: swelling above eye after taking Loratadine x 3 days in a row instead of as needed. Stopped up sinus issues-more than usual. Recent nasal congestion. Little mucus. Aware of deviated septum. Doing saline rinse.  06/20/14- 52 yoM never smoker referred courtesy of Dr Ronnald Ramp for allergy evaluation concerned about facial swelling - Aspirin allergy  Complicated by Rheumatic heart disease, HBP   wife here allergies; sinus congestion x1 week;  prod cough at timesw/yellow mucus; ears Follows for :stopped up last week; retina repair recently on LT eye In the last week had sinus infection treated with Sudafed and Claritin persistent left supraorbital headache which is not new. Angioedema finally attributed to aspirin and the problem is resolved with avoidance of aspirin.  ROS-see HPI Constitutional:   No-   weight loss, night sweats, fevers, chills, fatigue, lassitude. HEENT:   + headaches, difficulty swallowing, tooth/dental problems, sore throat,       No-  sneezing, itching, ear ache,  +nasal congestion, post nasal drip,  CV:  No-   chest pain, orthopnea, PND, swelling in lower  extremities, anasarca, dizziness, palpitations Resp: No-   shortness of breath with exertion or at rest.              No-   productive cough,  No non-productive cough,  No- coughing up of  blood.              No-   change in color of mucus.  No- wheezing.   Skin: See history of present illness GI:  No-   heartburn, indigestion, abdominal pain, nausea, vomiting,  GU:  MS:  No-   joint pain or swelling.  Neuro-     nothing unusual Psych:  No- change in mood or affect. No depression or anxiety.  No memory loss.  OBJ- Physical Exam General- Alert, Oriented, Affect-appropriate, Distress- none acute Skin- rash-none, lesions- none, excoriation- none Lymphadenopathy- none Head- atraumatic            Eyes- Gross vision intact, PERRLA, conjunctivae and secretions clear            Ears- Hearing, canals-normal            Nose- + sniffing and nasal stuffiness, +Septal dev ,No- mucus, polyps, erosion, perforation             Throat- Mallampati II , mucosa clear , drainage- none, tonsils- atrophic Neck- flexible , trachea midline, no stridor , thyroid nl, carotid no bruit Chest - symmetrical excursion , unlabored           Heart/CV- RRR , no murmur , no gallop  , no rub, nl s1 s2                           - JVD- none , edema- none, stasis changes- none, varices- none           Lung- clear to P&A, wheeze- none, cough- none , dullness-none, rub- none           Chest wall-  Abd- Br/ Gen/ Rectal- Not done, not indicated Extrem- cyanosis- none, clubbing, none, atrophy- none, strength- nl Neuro- grossly intact to observation

## 2014-06-28 DIAGNOSIS — Z888 Allergy status to other drugs, medicaments and biological substances status: Secondary | ICD-10-CM | POA: Insufficient documentation

## 2014-06-28 DIAGNOSIS — J0101 Acute recurrent maxillary sinusitis: Secondary | ICD-10-CM | POA: Insufficient documentation

## 2014-06-28 DIAGNOSIS — H33313 Horseshoe tear of retina without detachment, bilateral: Secondary | ICD-10-CM | POA: Diagnosis not present

## 2014-06-28 NOTE — Assessment & Plan Note (Signed)
Plan nasal saline rinse/Neti pot, nebulizer nasal decongestant treatment, Z-Pak

## 2014-06-28 NOTE — Assessment & Plan Note (Signed)
Avoidance of aspirin resolved his facial swelling

## 2014-06-30 MED ORDER — PHENYLEPHRINE HCL 1 % NA SOLN
3.0000 [drp] | Freq: Once | NASAL | Status: AC
  Administered 2014-06-30: 3 [drp] via NASAL

## 2014-06-30 NOTE — Addendum Note (Signed)
Addended by: Clayborne Dana C on: 06/30/2014 01:00 PM   Modules accepted: Orders

## 2014-07-18 DIAGNOSIS — H33313 Horseshoe tear of retina without detachment, bilateral: Secondary | ICD-10-CM | POA: Diagnosis not present

## 2014-07-18 DIAGNOSIS — H33321 Round hole, right eye: Secondary | ICD-10-CM | POA: Diagnosis not present

## 2014-07-18 DIAGNOSIS — H35413 Lattice degeneration of retina, bilateral: Secondary | ICD-10-CM | POA: Diagnosis not present

## 2014-08-09 DIAGNOSIS — H35412 Lattice degeneration of retina, left eye: Secondary | ICD-10-CM | POA: Diagnosis not present

## 2014-11-18 DIAGNOSIS — S0121XA Laceration without foreign body of nose, initial encounter: Secondary | ICD-10-CM | POA: Diagnosis not present

## 2014-11-20 ENCOUNTER — Ambulatory Visit (INDEPENDENT_AMBULATORY_CARE_PROVIDER_SITE_OTHER): Payer: Medicare Other | Admitting: Family Medicine

## 2014-11-20 VITALS — BP 138/80 | HR 71 | Temp 98.4°F | Resp 18 | Ht 73.5 in | Wt 215.2 lb

## 2014-11-20 DIAGNOSIS — S022XXA Fracture of nasal bones, initial encounter for closed fracture: Secondary | ICD-10-CM | POA: Diagnosis not present

## 2014-11-20 NOTE — Progress Notes (Signed)
This chart was scribed for Robyn Haber, MD by Moises Blood, medical scribe at [facility].The patient was seen in exam room 7 and the patient's care was started at 11:58 AM.  Patient ID: Patrick [name] MRN: [id number], DOB: [birth year], 74 y.o. Date of Encounter: 11/20/2014  Primary Physician: Scarlette Calico, MD  Chief Complaint:  Chief Complaint  Patient presents with   Follow-up    nose injury     HPI:  Patrick [name] is a 74 y.o. male who presents to [facility] for follow up for nose injury that occurred 2 days ago.  Pt was doing yard work at home. When he was finishing up, he took a step backwards on the 2-step stairs and slipped to hit his nose and his right knee on the deck. He had a bloody nose, applied pressure and ice to stop it. He denies right knee pain. He denies new headaches. He has sinus trouble and only takes tylenol as needed.   He went to another Urgent Care for treatment and had an X-ray done on his nose.    Past Medical History  Diagnosis Date   Atrial tachycardia    Rheumatic heart disease     74yrs old   Diverticulosis    Hemorrhoids    Blood transfusion 1944   Hyperlipemia    Prostate cancer     Gleason 6 status post radiation seed implant September 2010     Home Meds: Prior to Admission medications   Medication Sig Start Date End Date Taking? Authorizing Provider  acetaminophen (TYLENOL) 500 MG tablet Take 500 mg by mouth every 6 (six) hours as needed.   Yes Historical Provider, MD  azithromycin (ZITHROMAX) 250 MG tablet 2  Today then one daily 06/20/14  Yes Deneise Lever, MD  loratadine (CLARITIN) 10 MG tablet Take 10 mg by mouth daily as needed.   Yes Historical Provider, MD  phenylephrine (SUDAFED PE) 10 MG TABS tablet Take 10 mg by mouth every 4 (four) hours as needed.   Yes Historical Provider, MD    Allergies:  Allergies  Allergen Reactions   Asa Arthritis Strength-Antacid [Aspirin Buffered]  Swelling    angioedema   Penicillins Rash    History   Social History   Marital Status: Married    Spouse Name: N/A   Number of Children: N/A   Years of Education: N/A   Occupational History   Retired Sports administrator   Social History Main Topics   Smoking status: Never Smoker    Smokeless tobacco: Never Used   Alcohol Use: No   Drug Use: No   Sexual Activity: Not Currently   Other Topics Concern   Not on file   Social History Narrative   Regular exercise-Yes   Married   Lives in [place]     Review of Systems: Constitutional: negative for chills, fever, night sweats, weight changes, or fatigue  HEENT: negative for vision changes, hearing loss, congestion, rhinorrhea, ST, epistaxis, or sinus pressure Cardiovascular: negative for chest pain or palpitations Respiratory: negative for hemoptysis, wheezing, shortness of breath, or cough Abdominal: negative for abdominal pain, nausea, vomiting, diarrhea, or constipation Dermatological: negative for rash; positive for small wound (nose)  Neurologic: negative for headache, dizziness, or syncope All other systems reviewed and are otherwise negative with the exception to those above and in the HPI.  Physical Exam: Blood pressure 138/80, pulse 71, temperature 98.4 F (36.9 C), temperature source Oral, resp.  rate 18, height 6' 1.5" (1.867 m), weight 215 lb 3.2 oz (97.614 kg), SpO2 97 %., Body mass index is 28 kg/(m^2). General: Well developed, well nourished, in no acute distress. Head: Normocephalic, atraumatic, eyes without discharge, sclera non-icteric, nares are without discharge. Bilateral auditory canals clear, TM's are without perforation, pearly grey and translucent with reflective cone of light bilaterally. Oral cavity moist, posterior pharynx without exudate, erythema, peritonsillar abscess, or post nasal drip.  Neck: Supple. No thyromegaly. Full ROM. No lymphadenopathy.  Patient does have a deviated  nasal septum and both nasal passages are swollen and red without clots or active bleeding Lungs: Clear bilaterally to auscultation without wheezes, rales, or rhonchi. Breathing is unlabored. Heart: RRR with S1 S2. No murmurs, rubs, or gallops appreciated. Abdomen: Soft, non-tender, non-distended with normoactive bowel sounds. No hepatomegaly. No rebound/guarding. No obvious abdominal masses. Msk:  Strength and tone normal for age. Extremities/Skin: Warm and dry. No clubbing or cyanosis. No edema. No rashes or suspicious lesions. Patient has inferior orbital ecchymosis bilaterally, greater on the left and the bridge of his nose is moderately swollen and tender. Neuro: Alert and oriented X 3. Moves all extremities spontaneously. Gait is normal. CNII-XII grossly in tact. Psych:  Responds to questions appropriately with a normal affect.   I reviewed the patient's x-rays that he brought in and they do show a small nondisplaced nasal fracture.  ASSESSMENT AND PLAN:  74 y.o. year old male with  This chart was scribed in my presence and reviewed by me personally.    ICD-9-CM ICD-10-CM   1. Closed fracture nasal bone, initial encounter 802.0 S02.2XXA      Signed, Robyn Haber, MD    Signed, Robyn Haber, MD 11/20/2014 11:58 AM

## 2014-11-20 NOTE — Patient Instructions (Signed)
You actually do have a nondisplaced nasal fracture. The bones are in the right place and over the next 3 weeks the swelling will gradually go down.  It'll help if you take Afrin (or a generic form of it) at night in one nostril or the other, alternating sides for the next week.  You may stop the Cipro. There is no sign of infection and Cipro makes some people dizzy over time. It also impairs healing of cartilage and bone.  You do not need to wear a bandage over the bridge of the nose anymore.

## 2014-12-05 DIAGNOSIS — R8290 Unspecified abnormal findings in urine: Secondary | ICD-10-CM | POA: Diagnosis not present

## 2014-12-05 DIAGNOSIS — E785 Hyperlipidemia, unspecified: Secondary | ICD-10-CM | POA: Diagnosis not present

## 2014-12-12 DIAGNOSIS — Z0000 Encounter for general adult medical examination without abnormal findings: Secondary | ICD-10-CM | POA: Diagnosis not present

## 2014-12-12 DIAGNOSIS — Z1389 Encounter for screening for other disorder: Secondary | ICD-10-CM | POA: Diagnosis not present

## 2014-12-12 DIAGNOSIS — Z23 Encounter for immunization: Secondary | ICD-10-CM | POA: Diagnosis not present

## 2014-12-12 DIAGNOSIS — I471 Supraventricular tachycardia: Secondary | ICD-10-CM | POA: Diagnosis not present

## 2014-12-12 DIAGNOSIS — Z6827 Body mass index (BMI) 27.0-27.9, adult: Secondary | ICD-10-CM | POA: Diagnosis not present

## 2014-12-14 DIAGNOSIS — Z1212 Encounter for screening for malignant neoplasm of rectum: Secondary | ICD-10-CM | POA: Diagnosis not present

## 2015-01-23 DIAGNOSIS — L821 Other seborrheic keratosis: Secondary | ICD-10-CM | POA: Diagnosis not present

## 2015-01-23 DIAGNOSIS — Z86018 Personal history of other benign neoplasm: Secondary | ICD-10-CM | POA: Diagnosis not present

## 2015-01-23 DIAGNOSIS — D225 Melanocytic nevi of trunk: Secondary | ICD-10-CM | POA: Diagnosis not present

## 2015-01-23 DIAGNOSIS — Z411 Encounter for cosmetic surgery: Secondary | ICD-10-CM | POA: Diagnosis not present

## 2015-01-23 DIAGNOSIS — L570 Actinic keratosis: Secondary | ICD-10-CM | POA: Diagnosis not present

## 2015-01-23 DIAGNOSIS — Z85828 Personal history of other malignant neoplasm of skin: Secondary | ICD-10-CM | POA: Diagnosis not present

## 2015-03-02 DIAGNOSIS — Z23 Encounter for immunization: Secondary | ICD-10-CM | POA: Diagnosis not present

## 2015-03-17 DIAGNOSIS — M216X2 Other acquired deformities of left foot: Secondary | ICD-10-CM | POA: Diagnosis not present

## 2015-03-17 DIAGNOSIS — M722 Plantar fascial fibromatosis: Secondary | ICD-10-CM | POA: Diagnosis not present

## 2015-03-17 DIAGNOSIS — M216X1 Other acquired deformities of right foot: Secondary | ICD-10-CM | POA: Diagnosis not present

## 2015-03-20 DIAGNOSIS — Z8546 Personal history of malignant neoplasm of prostate: Secondary | ICD-10-CM | POA: Diagnosis not present

## 2015-03-29 DIAGNOSIS — R3129 Other microscopic hematuria: Secondary | ICD-10-CM | POA: Diagnosis not present

## 2015-03-29 DIAGNOSIS — Z8546 Personal history of malignant neoplasm of prostate: Secondary | ICD-10-CM | POA: Diagnosis not present

## 2015-04-06 DIAGNOSIS — M722 Plantar fascial fibromatosis: Secondary | ICD-10-CM | POA: Diagnosis not present

## 2015-04-27 DIAGNOSIS — M84375A Stress fracture, left foot, initial encounter for fracture: Secondary | ICD-10-CM | POA: Diagnosis not present

## 2015-04-27 DIAGNOSIS — M722 Plantar fascial fibromatosis: Secondary | ICD-10-CM | POA: Diagnosis not present

## 2015-04-27 DIAGNOSIS — M84374A Stress fracture, right foot, initial encounter for fracture: Secondary | ICD-10-CM | POA: Diagnosis not present

## 2015-05-18 DIAGNOSIS — M722 Plantar fascial fibromatosis: Secondary | ICD-10-CM | POA: Diagnosis not present

## 2015-05-18 DIAGNOSIS — M84374D Stress fracture, right foot, subsequent encounter for fracture with routine healing: Secondary | ICD-10-CM | POA: Diagnosis not present

## 2015-05-30 DIAGNOSIS — H35412 Lattice degeneration of retina, left eye: Secondary | ICD-10-CM | POA: Diagnosis not present

## 2015-06-15 DIAGNOSIS — Z23 Encounter for immunization: Secondary | ICD-10-CM | POA: Diagnosis not present

## 2015-06-15 DIAGNOSIS — L820 Inflamed seborrheic keratosis: Secondary | ICD-10-CM | POA: Diagnosis not present

## 2015-06-15 DIAGNOSIS — L570 Actinic keratosis: Secondary | ICD-10-CM | POA: Diagnosis not present

## 2015-07-26 ENCOUNTER — Ambulatory Visit (INDEPENDENT_AMBULATORY_CARE_PROVIDER_SITE_OTHER): Payer: Medicare Other | Admitting: Internal Medicine

## 2015-07-26 ENCOUNTER — Encounter: Payer: Self-pay | Admitting: Internal Medicine

## 2015-07-26 VITALS — BP 114/68 | HR 67 | Ht 74.5 in | Wt 208.0 lb

## 2015-07-26 DIAGNOSIS — J309 Allergic rhinitis, unspecified: Secondary | ICD-10-CM | POA: Diagnosis not present

## 2015-07-26 DIAGNOSIS — J3089 Other allergic rhinitis: Principal | ICD-10-CM

## 2015-07-26 DIAGNOSIS — J302 Other seasonal allergic rhinitis: Secondary | ICD-10-CM

## 2015-07-26 NOTE — Assessment & Plan Note (Signed)
Discussed role of his nasal septal deviation. Possible that Claritin is aggravating dry eye. Plan-try reducing amount of Claritin needed by using Flonase nasal spray. Otherwise okay to continue current meds as discussed.

## 2015-07-26 NOTE — Progress Notes (Signed)
11/28/11- 25 yoM never smoker referred courtesy of Dr Ronnald Ramp for allergy evaluation concerned about facial swelling 17,2013-? pollen attacks causing it? The first episode was about a year ago. Episodes have begun with lip swelling. Usually he gets left orbital edema noticed on waking in the morning. Episodes last 3 or 4 days then resolve. They're more common when he has been outside such as golfing or yard work. Most recent was 10/14/2011. He takes Zyrtec if he thinks he needs it as a preventative. He has a long history of nasal congestion and perennial stuffiness with drainage but has not considered himself significantly allergic. There is no history of cough, wheeze or asthma. Swelling episodes seemed better after he stopped taking aspirin last winter. Father had history of sinus disease without urticaria. Medical history significant for prostate seed therapy for cancer 3 years ago. Tonsillectomy. Hx SVT/ ablation. CT max fac- 05/15/11 IMPRESSION:  Asymmetric left malar and left supraorbital facial swelling with  some type of process replacing the subcutaneous fat. Cellulitis ng.  would be one consideration. An infiltrative dermatologic neoplasm  is not excluded. There is no underlying osseous destruction or  secondary reaction from paranasal sinus disease.  Original Report Authenticated By: Staci Righter, M.D.    01/13/12-71 yoM never smoker referred courtesy of Dr Ronnald Ramp for allergy evaluation concerned about facial swelling -? pollen attacks causing it? Still has not taken ASA since January - he had blamed aspirin-; no flare ups since 09-2011. He has been out doors a lot suspected of being a trigger. Occasional left frontal headache he is outdoors a lot. No history of seasonal rhinitis. Allergy Profile 12/03/2011-total IgE 34.6 with no specific elevations  03/23/12- 46 yoM never smoker referred courtesy of Dr Ronnald Ramp for allergy evaluation concerned about facial swelling -? pollen attacks causing  it? Follows For:  Feeling better - Denies facial swelling - Hoarseness since last week - Sinus congestion There been no more facial swelling episodes since last here. He has an incidental, unremarkable cold which is getting better just using saline nasal spray. He has avoided aspirin products out of suspicion that they might be a trigger. CT maxillofacial- reviewed with him. IMPRESSION:  Paranasal sinuses clear.  Original Report Authenticated By: Raelyn Number, M.D.   05/10/13- 7 yoM never smoker referred courtesy of Dr Ronnald Ramp for allergy evaluation concerned about facial swelling -? pollen attacks causing it?  Woman here? wife ACUTE VISIT: swelling above eye after taking Loratadine x 3 days in a row instead of as needed. Stopped up sinus issues-more than usual. Recent nasal congestion. Little mucus. Aware of deviated septum. Doing saline rinse.  06/20/14- 17 yoM never smoker referred courtesy of Dr Ronnald Ramp for allergy evaluation concerned about facial swelling - Aspirin allergy  Complicated by Rheumatic heart disease, HBP   wife here allergies; sinus congestion x1 week;  prod cough at timesw/yellow mucus; ears Follows for :stopped up last week; retina repair recently on LT eye In the last week had sinus infection treated with Sudafed and Claritin persistent left supraorbital headache which is not new. Angioedema finally attributed to aspirin and the problem is resolved with avoidance of aspirin.  07/26/2015-75 year old male never smoker followed for  allergic rhinitis, aspirin allergy, complicated by Rheumatic Heart Disease, HBP FOLLOWS FOR: Pt states he knows if outside for golf or working in the yard to take allergy medication. Otherwise doing well. He feels adequately controlled with occasional Claritin, Sudafed, and more frequent use of artificial tears. No wheezing  ROS-see  HPI Constitutional:   No-   weight loss, night sweats, fevers, chills, fatigue, lassitude. HEENT:   + headaches,  difficulty swallowing, tooth/dental problems, sore throat,       No-  sneezing, itching, ear ache,  +nasal congestion, post nasal drip,  CV:  No-   chest pain, orthopnea, PND, swelling in lower extremities, anasarca, dizziness, palpitations Resp: No-   shortness of breath with exertion or at rest.              No-   productive cough,  No non-productive cough,  No- coughing up of blood.              No-   change in color of mucus.  No- wheezing.   Skin: See history of present illness GI:  No-   heartburn, indigestion, abdominal pain, nausea, vomiting,  GU:  MS:  No-   joint pain or swelling.  Neuro-     nothing unusual Psych:  No- change in mood or affect. No depression or anxiety.  No memory loss.  OBJ- Physical Exam General- Alert, Oriented, Affect-appropriate, Distress- none acute Skin- rash-none, lesions- none, excoriation- none Lymphadenopathy- none Head- atraumatic            Eyes- Gross vision intact, PERRLA, conjunctivae and secretions clear            Ears- Hearing, canals-normal            Nose- + sniffing and nasal stuffiness, +Septal dev ,No- mucus, polyps, erosion, perforation             Throat- Mallampati II , mucosa clear , drainage- none, tonsils- atrophic Neck- flexible , trachea midline, no stridor , thyroid nl, carotid no bruit Chest - symmetrical excursion , unlabored           Heart/CV- RRR , no murmur , no gallop  , no rub, nl s1 s2                           - JVD- none , edema- none, stasis changes- none, varices- none           Lung- clear to P&A, wheeze- none, cough- none , dullness-none, rub- none           Chest wall-  Abd- Br/ Gen/ Rectal- Not done, not indicated Extrem- cyanosis- none, clubbing, none, atrophy- none, strength- nl Neuro- grossly intact to observation

## 2015-07-26 NOTE — Patient Instructions (Signed)
Ok to continue Claritin and Sudafed as needed  You could experiment with otc nasal spray Flonase/ fluticasone steroid anti-inflammatory. 1-2 puffs each nostril once daily at bedtime. This builds over several days. If it did well enough, you might be able to reduce the amount of drying Claritin you use, and maybe your eyes wouldn't get so dry.  Please call as needed

## 2015-07-31 DIAGNOSIS — M216X2 Other acquired deformities of left foot: Secondary | ICD-10-CM | POA: Diagnosis not present

## 2015-07-31 DIAGNOSIS — M216X1 Other acquired deformities of right foot: Secondary | ICD-10-CM | POA: Diagnosis not present

## 2015-08-08 DIAGNOSIS — L570 Actinic keratosis: Secondary | ICD-10-CM | POA: Diagnosis not present

## 2015-08-08 DIAGNOSIS — L820 Inflamed seborrheic keratosis: Secondary | ICD-10-CM | POA: Diagnosis not present

## 2015-12-11 DIAGNOSIS — E784 Other hyperlipidemia: Secondary | ICD-10-CM | POA: Diagnosis not present

## 2015-12-11 DIAGNOSIS — R8299 Other abnormal findings in urine: Secondary | ICD-10-CM | POA: Diagnosis not present

## 2015-12-20 DIAGNOSIS — Z1389 Encounter for screening for other disorder: Secondary | ICD-10-CM | POA: Diagnosis not present

## 2015-12-20 DIAGNOSIS — Z0000 Encounter for general adult medical examination without abnormal findings: Secondary | ICD-10-CM | POA: Diagnosis not present

## 2015-12-20 DIAGNOSIS — Z8546 Personal history of malignant neoplasm of prostate: Secondary | ICD-10-CM | POA: Diagnosis not present

## 2015-12-20 DIAGNOSIS — N200 Calculus of kidney: Secondary | ICD-10-CM | POA: Diagnosis not present

## 2015-12-20 DIAGNOSIS — E784 Other hyperlipidemia: Secondary | ICD-10-CM | POA: Diagnosis not present

## 2015-12-20 DIAGNOSIS — I471 Supraventricular tachycardia: Secondary | ICD-10-CM | POA: Diagnosis not present

## 2015-12-21 DIAGNOSIS — Z1212 Encounter for screening for malignant neoplasm of rectum: Secondary | ICD-10-CM | POA: Diagnosis not present

## 2016-02-05 DIAGNOSIS — D1801 Hemangioma of skin and subcutaneous tissue: Secondary | ICD-10-CM | POA: Diagnosis not present

## 2016-02-05 DIAGNOSIS — Z23 Encounter for immunization: Secondary | ICD-10-CM | POA: Diagnosis not present

## 2016-02-05 DIAGNOSIS — L820 Inflamed seborrheic keratosis: Secondary | ICD-10-CM | POA: Diagnosis not present

## 2016-02-05 DIAGNOSIS — D225 Melanocytic nevi of trunk: Secondary | ICD-10-CM | POA: Diagnosis not present

## 2016-02-05 DIAGNOSIS — L814 Other melanin hyperpigmentation: Secondary | ICD-10-CM | POA: Diagnosis not present

## 2016-02-05 DIAGNOSIS — L720 Epidermal cyst: Secondary | ICD-10-CM | POA: Diagnosis not present

## 2016-02-05 DIAGNOSIS — Z85828 Personal history of other malignant neoplasm of skin: Secondary | ICD-10-CM | POA: Diagnosis not present

## 2016-02-05 DIAGNOSIS — L821 Other seborrheic keratosis: Secondary | ICD-10-CM | POA: Diagnosis not present

## 2016-02-27 DIAGNOSIS — Z23 Encounter for immunization: Secondary | ICD-10-CM | POA: Diagnosis not present

## 2016-03-28 DIAGNOSIS — Z8546 Personal history of malignant neoplasm of prostate: Secondary | ICD-10-CM | POA: Diagnosis not present

## 2016-04-04 DIAGNOSIS — R3121 Asymptomatic microscopic hematuria: Secondary | ICD-10-CM | POA: Diagnosis not present

## 2016-04-04 DIAGNOSIS — Z8546 Personal history of malignant neoplasm of prostate: Secondary | ICD-10-CM | POA: Diagnosis not present

## 2016-04-04 DIAGNOSIS — N3040 Irradiation cystitis without hematuria: Secondary | ICD-10-CM | POA: Diagnosis not present

## 2016-04-04 DIAGNOSIS — N200 Calculus of kidney: Secondary | ICD-10-CM | POA: Diagnosis not present

## 2016-05-23 DIAGNOSIS — L570 Actinic keratosis: Secondary | ICD-10-CM | POA: Diagnosis not present

## 2016-05-23 DIAGNOSIS — Z23 Encounter for immunization: Secondary | ICD-10-CM | POA: Diagnosis not present

## 2016-05-23 DIAGNOSIS — L723 Sebaceous cyst: Secondary | ICD-10-CM | POA: Diagnosis not present

## 2016-05-23 DIAGNOSIS — L820 Inflamed seborrheic keratosis: Secondary | ICD-10-CM | POA: Diagnosis not present

## 2016-06-05 DIAGNOSIS — H35413 Lattice degeneration of retina, bilateral: Secondary | ICD-10-CM | POA: Diagnosis not present

## 2016-08-29 DIAGNOSIS — L820 Inflamed seborrheic keratosis: Secondary | ICD-10-CM | POA: Diagnosis not present

## 2016-08-29 DIAGNOSIS — L309 Dermatitis, unspecified: Secondary | ICD-10-CM | POA: Diagnosis not present

## 2016-09-05 ENCOUNTER — Encounter: Payer: Self-pay | Admitting: Internal Medicine

## 2016-09-25 DIAGNOSIS — L820 Inflamed seborrheic keratosis: Secondary | ICD-10-CM | POA: Diagnosis not present

## 2016-09-25 DIAGNOSIS — L309 Dermatitis, unspecified: Secondary | ICD-10-CM | POA: Diagnosis not present

## 2016-10-21 ENCOUNTER — Encounter: Payer: Self-pay | Admitting: Internal Medicine

## 2016-12-12 ENCOUNTER — Ambulatory Visit (AMBULATORY_SURGERY_CENTER): Payer: Self-pay

## 2016-12-12 VITALS — Ht 74.0 in | Wt 210.6 lb

## 2016-12-12 DIAGNOSIS — Z86010 Personal history of colonic polyps: Secondary | ICD-10-CM

## 2016-12-12 MED ORDER — NA SULFATE-K SULFATE-MG SULF 17.5-3.13-1.6 GM/177ML PO SOLN: 1.0000 | Freq: Once | ORAL | 0 refills | Status: AC

## 2016-12-12 NOTE — Progress Notes (Signed)
Denies allergies to eggs or soy products. Denies complication of anesthesia or sedation. Denies use of weight loss medication. Denies use of O2.   Emmi instructions given for colonoscopy.  Patient has a Aspirin allergy. Suprep has an Aspirin alert. Discussed with Dr. Henrene Pastor and he said that it is ok to use Suprep for preparation for colonoscopy.

## 2016-12-16 DIAGNOSIS — E784 Other hyperlipidemia: Secondary | ICD-10-CM | POA: Diagnosis not present

## 2016-12-16 DIAGNOSIS — R8299 Other abnormal findings in urine: Secondary | ICD-10-CM | POA: Diagnosis not present

## 2016-12-23 DIAGNOSIS — Z6827 Body mass index (BMI) 27.0-27.9, adult: Secondary | ICD-10-CM | POA: Diagnosis not present

## 2016-12-23 DIAGNOSIS — M25561 Pain in right knee: Secondary | ICD-10-CM | POA: Diagnosis not present

## 2016-12-23 DIAGNOSIS — Z1389 Encounter for screening for other disorder: Secondary | ICD-10-CM | POA: Diagnosis not present

## 2016-12-23 DIAGNOSIS — Z86010 Personal history of colonic polyps: Secondary | ICD-10-CM | POA: Diagnosis not present

## 2016-12-23 DIAGNOSIS — Z0000 Encounter for general adult medical examination without abnormal findings: Secondary | ICD-10-CM | POA: Diagnosis not present

## 2016-12-23 DIAGNOSIS — H6121 Impacted cerumen, right ear: Secondary | ICD-10-CM | POA: Diagnosis not present

## 2016-12-23 DIAGNOSIS — Z23 Encounter for immunization: Secondary | ICD-10-CM | POA: Diagnosis not present

## 2016-12-23 DIAGNOSIS — Z8546 Personal history of malignant neoplasm of prostate: Secondary | ICD-10-CM | POA: Diagnosis not present

## 2016-12-23 DIAGNOSIS — R3129 Other microscopic hematuria: Secondary | ICD-10-CM | POA: Diagnosis not present

## 2016-12-23 DIAGNOSIS — E784 Other hyperlipidemia: Secondary | ICD-10-CM | POA: Diagnosis not present

## 2016-12-24 DIAGNOSIS — Z1212 Encounter for screening for malignant neoplasm of rectum: Secondary | ICD-10-CM | POA: Diagnosis not present

## 2016-12-25 DIAGNOSIS — M1711 Unilateral primary osteoarthritis, right knee: Secondary | ICD-10-CM | POA: Diagnosis not present

## 2016-12-25 DIAGNOSIS — M170 Bilateral primary osteoarthritis of knee: Secondary | ICD-10-CM | POA: Diagnosis not present

## 2016-12-25 DIAGNOSIS — M1712 Unilateral primary osteoarthritis, left knee: Secondary | ICD-10-CM | POA: Diagnosis not present

## 2016-12-25 DIAGNOSIS — Z96652 Presence of left artificial knee joint: Secondary | ICD-10-CM | POA: Diagnosis not present

## 2016-12-25 DIAGNOSIS — Z471 Aftercare following joint replacement surgery: Secondary | ICD-10-CM | POA: Diagnosis not present

## 2016-12-26 ENCOUNTER — Encounter: Payer: Self-pay | Admitting: Internal Medicine

## 2016-12-26 ENCOUNTER — Ambulatory Visit (AMBULATORY_SURGERY_CENTER): Payer: Medicare Other | Admitting: Internal Medicine

## 2016-12-26 VITALS — BP 136/68 | HR 61 | Temp 98.4°F | Resp 13 | Ht 74.0 in | Wt 210.0 lb

## 2016-12-26 DIAGNOSIS — D123 Benign neoplasm of transverse colon: Secondary | ICD-10-CM

## 2016-12-26 DIAGNOSIS — D124 Benign neoplasm of descending colon: Secondary | ICD-10-CM

## 2016-12-26 DIAGNOSIS — Z86010 Personal history of colonic polyps: Secondary | ICD-10-CM

## 2016-12-26 DIAGNOSIS — D129 Benign neoplasm of anus and anal canal: Secondary | ICD-10-CM

## 2016-12-26 DIAGNOSIS — K621 Rectal polyp: Secondary | ICD-10-CM

## 2016-12-26 DIAGNOSIS — D128 Benign neoplasm of rectum: Secondary | ICD-10-CM

## 2016-12-26 DIAGNOSIS — D122 Benign neoplasm of ascending colon: Secondary | ICD-10-CM

## 2016-12-26 DIAGNOSIS — D120 Benign neoplasm of cecum: Secondary | ICD-10-CM

## 2016-12-26 MED ORDER — SODIUM CHLORIDE 0.9 % IV SOLN: 500.0000 mL | INTRAVENOUS | Status: DC

## 2016-12-26 NOTE — Op Note (Signed)
[facility] Patient Name: Patrick [name] Procedure Date: 12/26/2016 4:06 PM MRN: [id number] Endoscopist: Docia Chuck. Henrene Pastor , MD Age: 76 Referring MD:  Date of Birth: 01/01/1941 Gender: Male Account #: [id number] Procedure:                Colonoscopy, with cold snare polypectomy x 6 Indications:              High risk colon cancer surveillance: Personal                            history of non-advanced adenoma. Previous                            examinations 2006 and 2013 Medicines:                Monitored Anesthesia Care Procedure:                Pre-Anesthesia Assessment:                           - Prior to the procedure, a History and Physical                            was performed, and patient medications and                            allergies were reviewed. The patient's tolerance of                            previous anesthesia was also reviewed. The risks                            and benefits of the procedure and the sedation                            options and risks were discussed with the patient.                            All questions were answered, and informed consent                            was obtained. Prior Anticoagulants: The patient has                            taken no previous anticoagulant or antiplatelet                            agents. ASA Grade Assessment: II - A patient with                            mild systemic disease. After reviewing the risks                            and benefits, the patient was deemed in  satisfactory condition to undergo the procedure.                           After obtaining informed consent, the colonoscope                            was passed under direct vision. Throughout the                            procedure, the patient's blood pressure, pulse, and                            oxygen saturations were monitored continuously. The                            Model CF-HQ190L  [id number]) scope was introduced                            through the anus and advanced to the the cecum,                            identified by appendiceal orifice and ileocecal                            valve. The ileocecal valve, appendiceal orifice,                            and rectum were photographed. The quality of the                            bowel preparation was excellent. The colonoscopy                            was performed without difficulty. The patient                            tolerated the procedure well. The bowel preparation                            used was SUPREP. Scope In: 4:22:12 PM Scope Out: 4:41:30 PM Scope Withdrawal Time: 0 hours 16 minutes 2 seconds  Total Procedure Duration: 0 hours 19 minutes 18 seconds  Findings:                 Six polyps were found in the rectum, descending                            colon, transverse colon, ascending colon and cecum.                            The polyps were 1 to 3 mm in size. These polyps                            were removed with a cold snare. Resection and  retrieval were complete.                           A few [Opening] diverticula were found in the                            sigmoid colon and ascending colon.                           Internal hemorrhoids were found during                            retroflexion. The hemorrhoids were small.                           The exam was otherwise without abnormality on                            direct and retroflexion views. Complications:            No immediate complications. Estimated blood loss:                            None. Estimated Blood Loss:     Estimated blood loss: none. Impression:               - Six 1 to 3 mm polyps in the rectum, in the                            descending colon, in the transverse colon, in the                            ascending colon and in the cecum, removed with a                             cold snare. Resected and retrieved.                           - Diverticulosis in the sigmoid colon and in the                            ascending colon.                           - Internal hemorrhoids.                           - The examination was otherwise normal on direct                            and retroflexion views. Recommendation:           - Repeat colonoscopy in 3 - 5 years for                            surveillance.                           -  Patient has a contact number available for                            emergencies. The signs and symptoms of potential                            delayed complications were discussed with the                            patient. Return to normal activities tomorrow.                            Written discharge instructions were provided to the                            patient.                           - Resume previous diet.                           - Continue present medications.                           - Await pathology results. Docia Chuck. Henrene Pastor, MD 12/26/2016 4:48:51 PM This report has been signed electronically.

## 2016-12-26 NOTE — Progress Notes (Signed)
Called to room to assist during endoscopic procedure.  Patient ID and intended procedure confirmed with present staff. Received instructions for my participation in the procedure from the performing physician.  

## 2016-12-26 NOTE — Progress Notes (Signed)
Pt's states no medical or surgical changes since previsit or office visit.Pt's states no medical or surgical changes since previsit or office visit.Pt's states no medical or surgical changes since previsit or office visit.

## 2016-12-26 NOTE — Progress Notes (Signed)
Report to PACU, RN, vss, BBS= Clear.  

## 2016-12-26 NOTE — Patient Instructions (Signed)
YOU HAD AN ENDOSCOPIC PROCEDURE TODAY AT [facility]:   Refer to the procedure report that was given to you for any specific questions about what was found during the examination.  If the procedure report does not answer your questions, please call your gastroenterologist to clarify.  If you requested that your care partner not be given the details of your procedure findings, then the procedure report has been included in a sealed envelope for you to review at your convenience later.  YOU SHOULD EXPECT: Some feelings of bloating in the abdomen. Passage of more gas than usual.  Walking can help get rid of the air that was put into your GI tract during the procedure and reduce the bloating. If you had a lower endoscopy (such as a colonoscopy or flexible sigmoidoscopy) you may notice spotting of blood in your stool or on the toilet paper. If you underwent a bowel prep for your procedure, you may not have a normal bowel movement for a few days.  Please Note:  You might notice some irritation and congestion in your nose or some drainage.  This is from the oxygen used during your procedure.  There is no need for concern and it should clear up in a day or so.  SYMPTOMS TO REPORT IMMEDIATELY:   Following lower endoscopy (colonoscopy or flexible sigmoidoscopy):  Excessive amounts of blood in the stool  Significant tenderness or worsening of abdominal pains  Swelling of the abdomen that is new, acute  Fever of 100F or higher  For urgent or emergent issues, a gastroenterologist can be reached at any hour by calling [phone].  DIET:  We do recommend a small meal at first, but then you may proceed to your regular diet.  Drink plenty of fluids but you should avoid alcoholic beverages for 24 hours.  ACTIVITY:  You should plan to take it easy for the rest of today and you should NOT DRIVE or use heavy machinery until tomorrow (because of the sedation medicines used during the test).     FOLLOW UP: Our staff will call the number listed on your records the next business day following your procedure to check on you and address any questions or concerns that you may have regarding the information given to you following your procedure. If we do not reach you, we will leave a message.  However, if you are feeling well and you are not experiencing any problems, there is no need to return our call.  We will assume that you have returned to your regular daily activities without incident.  If any biopsies were taken you will be contacted by phone or by letter within the next 1-3 weeks.  Please call us at [phone] if you have not heard about the biopsies in 3 weeks.   SIGNATURES/CONFIDENTIALITY: You and/or your care partner have signed paperwork which will be entered into your electronic medical record.  These signatures attest to the fact that that the information above on your After Visit Summary has been reviewed and is understood.  Full responsibility of the confidentiality of this discharge information lies with you and/or your care-partner.  Please read over handouts about polyps, diverticulosis, hemorrhoids and high fiber diets  Continue your normal medications  Await pathology

## 2016-12-27 ENCOUNTER — Telehealth: Payer: Self-pay | Admitting: *Deleted

## 2016-12-27 NOTE — Telephone Encounter (Signed)
  Follow up Call-  Call back number 12/26/2016  Post procedure Call Back phone  # [phone]  Permission to leave phone message Yes  Some recent data might be hidden     Patient questions:  Do you have a fever, pain , or abdominal swelling? No. Pain Score  0 *  Have you tolerated food without any problems? Yes.    Have you been able to return to your normal activities? Yes.    Do you have any questions about your discharge instructions: Diet   No. Medications  No. Follow up visit  No.  Do you have questions or concerns about your Care? No.  Actions: * If pain score is 4 or above: No action needed, pain <4.

## 2017-01-08 ENCOUNTER — Encounter: Payer: Self-pay | Admitting: Internal Medicine

## 2017-02-24 DIAGNOSIS — L814 Other melanin hyperpigmentation: Secondary | ICD-10-CM | POA: Diagnosis not present

## 2017-02-24 DIAGNOSIS — Z85828 Personal history of other malignant neoplasm of skin: Secondary | ICD-10-CM | POA: Diagnosis not present

## 2017-02-24 DIAGNOSIS — L821 Other seborrheic keratosis: Secondary | ICD-10-CM | POA: Diagnosis not present

## 2017-02-24 DIAGNOSIS — L820 Inflamed seborrheic keratosis: Secondary | ICD-10-CM | POA: Diagnosis not present

## 2017-02-24 DIAGNOSIS — D225 Melanocytic nevi of trunk: Secondary | ICD-10-CM | POA: Diagnosis not present

## 2017-02-24 DIAGNOSIS — L853 Xerosis cutis: Secondary | ICD-10-CM | POA: Diagnosis not present

## 2017-02-24 DIAGNOSIS — Z23 Encounter for immunization: Secondary | ICD-10-CM | POA: Diagnosis not present

## 2017-02-24 DIAGNOSIS — L309 Dermatitis, unspecified: Secondary | ICD-10-CM | POA: Diagnosis not present

## 2017-02-24 DIAGNOSIS — D1801 Hemangioma of skin and subcutaneous tissue: Secondary | ICD-10-CM | POA: Diagnosis not present

## 2017-03-02 DIAGNOSIS — Z23 Encounter for immunization: Secondary | ICD-10-CM | POA: Diagnosis not present

## 2017-03-31 DIAGNOSIS — Z8546 Personal history of malignant neoplasm of prostate: Secondary | ICD-10-CM | POA: Diagnosis not present

## 2017-04-03 DIAGNOSIS — H1131 Conjunctival hemorrhage, right eye: Secondary | ICD-10-CM | POA: Diagnosis not present

## 2017-04-08 DIAGNOSIS — Z8546 Personal history of malignant neoplasm of prostate: Secondary | ICD-10-CM | POA: Diagnosis not present

## 2017-04-08 DIAGNOSIS — R3121 Asymptomatic microscopic hematuria: Secondary | ICD-10-CM | POA: Diagnosis not present

## 2017-05-09 DIAGNOSIS — H02839 Dermatochalasis of unspecified eye, unspecified eyelid: Secondary | ICD-10-CM | POA: Diagnosis not present

## 2017-05-09 DIAGNOSIS — Z961 Presence of intraocular lens: Secondary | ICD-10-CM | POA: Diagnosis not present

## 2017-05-09 DIAGNOSIS — H26491 Other secondary cataract, right eye: Secondary | ICD-10-CM | POA: Diagnosis not present

## 2017-05-09 DIAGNOSIS — H18413 Arcus senilis, bilateral: Secondary | ICD-10-CM | POA: Diagnosis not present

## 2017-07-28 DIAGNOSIS — H35413 Lattice degeneration of retina, bilateral: Secondary | ICD-10-CM | POA: Diagnosis not present

## 2017-08-28 DIAGNOSIS — M533 Sacrococcygeal disorders, not elsewhere classified: Secondary | ICD-10-CM | POA: Diagnosis not present

## 2017-08-28 DIAGNOSIS — M5416 Radiculopathy, lumbar region: Secondary | ICD-10-CM | POA: Diagnosis not present

## 2017-09-17 DIAGNOSIS — M533 Sacrococcygeal disorders, not elsewhere classified: Secondary | ICD-10-CM | POA: Diagnosis not present

## 2017-10-01 DIAGNOSIS — M5431 Sciatica, right side: Secondary | ICD-10-CM | POA: Diagnosis not present

## 2017-11-07 DIAGNOSIS — M1711 Unilateral primary osteoarthritis, right knee: Secondary | ICD-10-CM | POA: Diagnosis not present

## 2017-12-01 DIAGNOSIS — M179 Osteoarthritis of knee, unspecified: Secondary | ICD-10-CM | POA: Insufficient documentation

## 2017-12-01 DIAGNOSIS — M1711 Unilateral primary osteoarthritis, right knee: Secondary | ICD-10-CM | POA: Diagnosis not present

## 2017-12-15 DIAGNOSIS — E7849 Other hyperlipidemia: Secondary | ICD-10-CM | POA: Diagnosis not present

## 2017-12-15 DIAGNOSIS — R82998 Other abnormal findings in urine: Secondary | ICD-10-CM | POA: Diagnosis not present

## 2017-12-24 DIAGNOSIS — M25562 Pain in left knee: Secondary | ICD-10-CM | POA: Diagnosis not present

## 2017-12-24 DIAGNOSIS — Z0000 Encounter for general adult medical examination without abnormal findings: Secondary | ICD-10-CM | POA: Diagnosis not present

## 2017-12-24 DIAGNOSIS — E7849 Other hyperlipidemia: Secondary | ICD-10-CM | POA: Diagnosis not present

## 2017-12-24 DIAGNOSIS — H6123 Impacted cerumen, bilateral: Secondary | ICD-10-CM | POA: Diagnosis not present

## 2017-12-24 DIAGNOSIS — Z8546 Personal history of malignant neoplasm of prostate: Secondary | ICD-10-CM | POA: Diagnosis not present

## 2017-12-24 DIAGNOSIS — Z6827 Body mass index (BMI) 27.0-27.9, adult: Secondary | ICD-10-CM | POA: Diagnosis not present

## 2017-12-24 DIAGNOSIS — Z1389 Encounter for screening for other disorder: Secondary | ICD-10-CM | POA: Diagnosis not present

## 2017-12-24 DIAGNOSIS — Z86010 Personal history of colonic polyps: Secondary | ICD-10-CM | POA: Diagnosis not present

## 2017-12-24 DIAGNOSIS — I471 Supraventricular tachycardia: Secondary | ICD-10-CM | POA: Diagnosis not present

## 2017-12-30 DIAGNOSIS — Z1212 Encounter for screening for malignant neoplasm of rectum: Secondary | ICD-10-CM | POA: Diagnosis not present

## 2018-02-27 DIAGNOSIS — Z23 Encounter for immunization: Secondary | ICD-10-CM | POA: Diagnosis not present

## 2018-03-02 DIAGNOSIS — Z23 Encounter for immunization: Secondary | ICD-10-CM | POA: Diagnosis not present

## 2018-03-02 DIAGNOSIS — L309 Dermatitis, unspecified: Secondary | ICD-10-CM | POA: Diagnosis not present

## 2018-03-02 DIAGNOSIS — L570 Actinic keratosis: Secondary | ICD-10-CM | POA: Diagnosis not present

## 2018-03-02 DIAGNOSIS — L814 Other melanin hyperpigmentation: Secondary | ICD-10-CM | POA: Diagnosis not present

## 2018-03-02 DIAGNOSIS — L821 Other seborrheic keratosis: Secondary | ICD-10-CM | POA: Diagnosis not present

## 2018-03-02 DIAGNOSIS — Z85828 Personal history of other malignant neoplasm of skin: Secondary | ICD-10-CM | POA: Diagnosis not present

## 2018-03-02 DIAGNOSIS — D225 Melanocytic nevi of trunk: Secondary | ICD-10-CM | POA: Diagnosis not present

## 2018-03-31 DIAGNOSIS — M79671 Pain in right foot: Secondary | ICD-10-CM | POA: Diagnosis not present

## 2018-04-07 DIAGNOSIS — Z8546 Personal history of malignant neoplasm of prostate: Secondary | ICD-10-CM | POA: Diagnosis not present

## 2018-04-13 DIAGNOSIS — N5201 Erectile dysfunction due to arterial insufficiency: Secondary | ICD-10-CM | POA: Diagnosis not present

## 2018-04-13 DIAGNOSIS — Z8546 Personal history of malignant neoplasm of prostate: Secondary | ICD-10-CM | POA: Diagnosis not present

## 2018-04-13 DIAGNOSIS — R351 Nocturia: Secondary | ICD-10-CM | POA: Diagnosis not present

## 2018-04-14 ENCOUNTER — Telehealth: Payer: Self-pay

## 2018-04-14 DIAGNOSIS — Z6828 Body mass index (BMI) 28.0-28.9, adult: Secondary | ICD-10-CM | POA: Diagnosis not present

## 2018-04-14 DIAGNOSIS — I471 Supraventricular tachycardia: Secondary | ICD-10-CM | POA: Diagnosis not present

## 2018-04-14 DIAGNOSIS — R030 Elevated blood-pressure reading, without diagnosis of hypertension: Secondary | ICD-10-CM | POA: Diagnosis not present

## 2018-04-14 DIAGNOSIS — I10 Essential (primary) hypertension: Secondary | ICD-10-CM | POA: Diagnosis not present

## 2018-04-14 NOTE — Telephone Encounter (Signed)
Call received from [facility] with [facility].  Have Pt at their office and want Pt seen today for elevated BP/HR.  Per Ebony Hail Pt EKG sinus tach HR 100.  Dr. Lovena Le advised to have Pt medication now, tonight and tomorrow morning.  Will schedule Pt to be seen in clinic tomorrow.

## 2018-04-15 ENCOUNTER — Ambulatory Visit (INDEPENDENT_AMBULATORY_CARE_PROVIDER_SITE_OTHER): Payer: Medicare Other | Admitting: Internal Medicine

## 2018-04-15 VITALS — BP 132/88 | HR 94 | Ht 74.0 in | Wt 215.0 lb

## 2018-04-15 DIAGNOSIS — I471 Supraventricular tachycardia: Secondary | ICD-10-CM

## 2018-04-15 NOTE — Progress Notes (Signed)
Electrophysiology Office Note Date: 04/15/2018  ID:  Patrick, [name] [birth year], MRN [id number]  PCP: Velna Hatchet, MD Primary Cardiologist: Dr Caryl Comes Electrophysiologist: Dr Klein/Dr [name]  CC: Evaluation for heart racing/high BP  Patrick [name] is a 77 y.o. [name] seen today at the request of Dr Velna Hatchet for evaluation of tachycardia and elevated BP. He has a PMH of atrial tachycardia s/p ablation 2011 with Dr [name], rheumatic fever, HTN and hyperlipidemia. Patient reports that over the last 10 days, he has had heart racing symptoms associated with shortness of breath that occurs with activity but can occur at rest as well. It feels similar to the episodes he had prior to his ablation. At his urology visit a few days ago, he reports his HR was 143, we do not have records for review today.  He was started on Toprol yesterday by his PCP.  He denies chest pain, dyspnea, PND, orthopnea, nausea, vomiting, dizziness, syncope, edema, weight gain, or early satiety. +palpitations, +SOB  Past Medical History:  Diagnosis Date  . Allergy   . Atrial tachycardia ([place])   . Blood transfusion 1944  . Cataract   . Diverticulosis   . Heart murmur   . Hemorrhoids   . Hyperlipemia   . Prostate cancer [facility])    Gleason 6 status post radiation seed implant September 2010  . Rheumatic heart disease    78yrs old   Past Surgical History:  Procedure Laterality Date  . CARDIAC ELECTROPHYSIOLOGY STUDY AND ABLATION  2011   for SVT  . CATARACT EXTRACTION W/ INTRAOCULAR LENS IMPLANT  2009   right  . INGUINAL HERNIA REPAIR  01/2006   right  . MELANOMA EXCISION  2011   superficial resection left maxillary area/ Dr [name]  . TONSILLECTOMY AND ADENOIDECTOMY  1945  . TOTAL KNEE ARTHROPLASTY  1/11   s/p left  . WRIST FUSION  2006   04/2004    Current Outpatient Medications  Medication Sig Dispense Refill  . acetaminophen (TYLENOL) 500 MG tablet Take 500 mg by mouth every 6 (six) hours as  needed.    . loratadine (CLARITIN) 10 MG tablet Take 10 mg by mouth daily as needed.    . metoprolol succinate (TOPROL-XL) 25 MG 24 hr tablet Take 25 mg by mouth daily.    Marland Kitchen Propylene Glycol (SYSTANE BALANCE) 0.6 % SOLN Apply 1 drop to eye at bedtime.     Current Facility-Administered Medications  Medication Dose Route Frequency Provider Last Rate Last Dose  . 0.9 %  sodium chloride infusion  500 mL Intravenous Continuous Irene Shipper, MD        Allergies:   Diona Fanti arthritis strength-antacid [aspirin buffered] and Penicillins   Social History: Social History   Socioeconomic History  . Marital status: Married    Spouse name: Not on file  . Number of children: Not on file  . Years of education: Not on file  . Highest education level: Not on file  Occupational History  . Occupation: Retired    Fish farm manager: [employer]  Social Needs  . Financial resource strain: Not on file  . Food insecurity:    Worry: Not on file    Inability: Not on file  . Transportation needs:    Medical: Not on file    Non-medical: Not on file  Tobacco Use  . Smoking status: Never Smoker  . Smokeless tobacco: Never Used  Substance and Sexual Activity  . Alcohol use: No  .  Drug use: No  . Sexual activity: Not Currently  Lifestyle  . Physical activity:    Days per week: Not on file    Minutes per session: Not on file  . Stress: Not on file  Relationships  . Social connections:    Talks on phone: Not on file    Gets together: Not on file    Attends religious service: Not on file    Active member of club or organization: Not on file    Attends meetings of clubs or organizations: Not on file    Relationship status: Not on file  . Intimate partner violence:    Fear of current or ex partner: Not on file    Emotionally abused: Not on file    Physically abused: Not on file    Forced sexual activity: Not on file  Other Topics Concern  . Not on file  Social History Narrative   Regular exercise-Yes     Married   Lives in [place]    Family History: Family History  Problem Relation Age of Onset  . Arthritis Other   . Cancer Other        Breast cancer 1st degree relative<50  . Breast cancer Mother   . Breast cancer Maternal Grandmother   . Colon cancer Neg Hx   . Esophageal cancer Neg Hx   . Pancreatic cancer Neg Hx   . Rectal cancer Neg Hx   . Stomach cancer Neg Hx     Review of Systems: All other systems reviewed and are otherwise negative except as noted above.   Physical Exam: VS:  BP 132/88   Pulse 94   Ht 6\' 2"  (1.88 m)   Wt 215 lb (97.5 kg)   SpO2 99%   BMI 27.60 kg/m  , BMI Body mass index is 27.6 kg/m. Wt Readings from Last 3 Encounters:  04/15/18 215 lb (97.5 kg)  12/26/16 210 lb (95.3 kg)  12/12/16 210 lb 9.6 oz (95.5 kg)    GEN- The patient is well appearing, alert and oriented x 3 today.   HEENT: normocephalic, atraumatic; sclera clear, conjunctiva pink; hearing intact; oropharynx clear; neck supple, no JVP Lungs- Clear to ausculation bilaterally, normal work of breathing.  No wheezes, rales, rhonchi Heart- Regular rate and rhythm, no murmurs, rubs or gallops, PMI not laterally displaced Extremities- no clubbing, cyanosis, or edema; DP/PT/radial pulses 2+ bilaterally MS- no significant deformity or atrophy Skin- warm and dry, no rash or lesion  Psych- euthymic mood, full affect Neuro- strength and sensation are intact   EKG:  EKG is ordered today. The ekg ordered today shows sinus rhythm HR 94 PACs, LAFB, PR 176, QRS 98, QTc 477   Assessment and Plan:  1. Atrial tachycardia Patient s/p ablation 2011 and has done very well since then. Now have symptoms of heart racing for the last 10 days.  We dicussed therapeutic options including medications and ablation. Due to his history of rheumatic fever, he would be a difficult candidate for repeat ablation. Agree with continuing Toprol 25 mg daily. Has not had an echocardiogram in several years,  we will repeat this. He has a history of diastolic dysfunction. Patient to look into Alivecor/Kardia to monitor his HR at home. If symptoms persist on BB, we can titrate dose and consider cardiac monitoring to further define arrhythmia.   2. HTN Patient reports elevated readings at his PCP office. BP stable today after one dose of Toprol.  Continue Toprol 25 mg daily  for now. Patient agrees to monitor his BP at home. Will titrate therapy based on readings.   Current medicines are reviewed at length with the patient today.   The patient does not have concerns regarding his medicines.  The following changes were made today:  none  Labs/ tests ordered today include:  Orders Placed This Encounter  Procedures  . ECHOCARDIOGRAM COMPLETE     Disposition:   Follow up with Dr [name] 6 weeks   Signed, [name] MD 04/15/2018 1:43 PM   [address] [place] [address] [phone] (office) [phone] (fax)

## 2018-04-15 NOTE — Addendum Note (Signed)
Addended by: Rose Phi on: 04/15/2018 05:30 PM   Modules accepted: Orders

## 2018-04-15 NOTE — Patient Instructions (Addendum)
Medication Instructions:  Your physician recommends that you continue on your current medications as directed. Please refer to the Current Medication list given to you today.  If you need a refill on your cardiac medications before your next appointment, please call your pharmacy.   Lab work: None ordered  Testing/Procedures: Your physician has requested that you have an echocardiogram - complete this before your follow up with Dr. [name]. Echocardiography is a painless test that uses sound waves to create images of your heart. It provides your doctor with information about the size and shape of your heart and how well your heart's chambers and valves are working. This procedure takes approximately one hour. There are no restrictions for this procedure.  Follow-Up: At [facility], you and your health needs are our priority.  As part of our continuing mission to provide you with exceptional heart care, we have created designated Provider Care Teams.  These Care Teams include your primary Cardiologist (physician) and Advanced Practice Providers (APPs -  Physician Assistants and Nurse Practitioners) who all work together to provide you with the care you need, when you need it. . You will need a follow up appointment in 6 weeks with Dr. [name] - after your echocardiogram has been completed.  Thank you for choosing [facility]!!    Any Other Special Instructions Will Be Listed Below (If Applicable).   AliveCor  FDA-cleared EKG at your fingertips. - [employer]   Agricultural engineer, [employer]. [email or web address]://store.alivecor.com/products/[email or web address]   FDA-cleared, clinical grade mobile EKG monitor: Patrick [name] is the most clinically-validated mobile EKG used by the world's leading cardiac care medical professionals.

## 2018-04-16 ENCOUNTER — Telehealth: Payer: Self-pay

## 2018-04-16 ENCOUNTER — Telehealth: Payer: Self-pay | Admitting: Internal Medicine

## 2018-04-16 ENCOUNTER — Ambulatory Visit (HOSPITAL_COMMUNITY): Payer: Medicare Other | Attending: Cardiology

## 2018-04-16 ENCOUNTER — Other Ambulatory Visit: Payer: Self-pay

## 2018-04-16 DIAGNOSIS — I471 Supraventricular tachycardia: Secondary | ICD-10-CM | POA: Diagnosis not present

## 2018-04-16 MED ORDER — PERFLUTREN LIPID MICROSPHERE
1.0000 mL | INTRAVENOUS | Status: AC | PRN
Start: 2018-04-16 — End: 2018-04-16
  Administered 2018-04-16: 2 mL via INTRAVENOUS

## 2018-04-16 NOTE — Telephone Encounter (Signed)
Spoke with pt and continues to have elevated heart rate at 158 and B/p was 133/118 Per pt no other symptoms other than no appetite  Discussed with Patrick [name] Standard PA  Have pt continue to monitor and call later today to get an update. Spoke with pt and agrees Will call pt back around noon and pt states will check V/S at 11:00 AM .[name]

## 2018-04-16 NOTE — Telephone Encounter (Signed)
GAVE REFERRAL AND NOTES TO MELISSA

## 2018-04-16 NOTE — Telephone Encounter (Signed)
Patient was here today for echo and was seen 04-17-18.  He stated that something is not right his heart rate is staying high 120 up to 150.  This was going on doing the Echo.   Please call to advise.

## 2018-04-16 NOTE — Telephone Encounter (Signed)
Pt checked a couple of B/p  140/97 and 119/96 and heart rate 104 and 125 around 11:00 am walked to mail box  ([place] ) and after getting back was SOB.Discussed with Tommye Standard  PA. Pt to take an extra Metoprolol today only and continue to monitor.Pt aware will call with echo results once reviewed ./cy

## 2018-04-17 MED ORDER — METOPROLOL SUCCINATE ER 25 MG PO TB24: 25.0000 mg | ORAL_TABLET | Freq: Every day | ORAL | 3 refills | Status: DC

## 2018-04-17 NOTE — Telephone Encounter (Signed)
Per pt feels a lot better b/p at 4:15 pm  yesterday was 135/85 hr 98 at 8:50 pm was 120/91 hr 82 and this am at 8:05 am was 129/85 hr 85.Pt will continue to monitor and if notes HR creeping back up to call office.Pt agrees./cy

## 2018-04-20 ENCOUNTER — Telehealth: Payer: Self-pay | Admitting: Internal Medicine

## 2018-04-20 MED ORDER — METOPROLOL SUCCINATE ER 50 MG PO TB24: 50.0000 mg | ORAL_TABLET | Freq: Every day | ORAL | 3 refills | Status: DC

## 2018-04-20 NOTE — Telephone Encounter (Signed)
Spoke with the patient, he felt great on Friday, but on Saturday his heart rate was around 138-107. One Sunday he also has heart rates of 100+ until later in the evening that decreased to 83. Today he is feeling okay, but wanted to see what can be done about the increasing heart rates. His blood pressure today, 135/95. Spoke with Dr. [name], he stated to increase the Metoprolol to 50 mg, daily. Advised the patient to call our office back if concerns and explained signs of low blood pressure.

## 2018-04-20 NOTE — Telephone Encounter (Signed)
New message   Patient c/o Palpitations:  High priority if patient c/o lightheadedness, shortness of breath, or chest pain  1) How long have you had palpitations/irregular HR/ Afib? Are you having the symptoms now? Over a week, no symptoms at this time   2) Are you currently experiencing lightheadedness, SOB or CP? No, patient states that if he exerts himself he will become sob   3) Do you have a history of afib (atrial fibrillation) or irregular heart rhythm? No    4) Have you checked your BP or HR? (document readings if available): patient states that his b/p is normal  5) Are you experiencing any other symptoms? No

## 2018-04-23 ENCOUNTER — Telehealth: Payer: Self-pay | Admitting: Nurse Practitioner

## 2018-04-23 NOTE — Telephone Encounter (Signed)
Reviewed echo results with patient who verbalized understanding. He states he continues to have SOB but it is better, however he is not very active. He states his HR is improved with increase of Toprol to 50 mg as advised on 12/23. States HR down to 70's to 80's bpm yesterday and today and systolic BP 789'F to 810'F mmHg. Patient requests to see Dr. Stanford Breed because he is his wife, Anne's, doctor. I advised him to keep appointment with Dr. [name] on 1/29 and that I will forward message to Fredia Beets, RN to schedule new patient appointment. Patient verbalized understanding and agreement with plan and thanked me for the call.

## 2018-04-23 NOTE — Telephone Encounter (Signed)
Spoke with pt, Follow up scheduled with dr Stanford Breed.

## 2018-04-23 NOTE — Telephone Encounter (Signed)
-----   Message from [name], MD sent at 04/21/2018  4:38 PM EST ----- Results reviewed.  Sonia Baller, please inform pt of result.   EF is newly depressed.  Will need follow-up with genearl cardiology for further evaluation and initiation of therapy prior to visit with me at the end of January.  Please refer to general cardiology team. I will route to primary care also.

## 2018-04-28 NOTE — H&P (View-Only) (Signed)
Referring-Patrick Allred, MD Reason for referral-cardiomyopathy and atrial tachycardia  HPI: 77 year old male for evaluation of cardiomyopathy and atrial tachycardia at request of Patrick Grayer, MD.  Patient has had previous ablation of atrial tachycardia in 2011.  Patient seen December 2019 with complaints of palpitations.  He was found to be in atrial tachycardia.  Patient was placed on beta-blocker.  He was felt to be at increased risk for repeat ablation and history of rheumatic fever.  Echo was ordered and showed newly reduced LV function with ejection fraction 20 to 25%, mild left ventricular hypertrophy, mild mitral regurgitation.  His heart rate during the study was 120-160.  Cardiology now asked to evaluate.  Patient has had mild dyspnea on exertion which is new.  No orthopnea, PND, pedal edema, syncope or chest pain.  Occasional mild palpitations.  No history of bleeding.  Current Outpatient Medications  Medication Sig Dispense Refill  . acetaminophen (TYLENOL) 500 MG tablet Take 500 mg by mouth every 6 (six) hours as needed.    . loratadine (CLARITIN) 10 MG tablet Take 10 mg by mouth daily as needed.    . metoprolol succinate (TOPROL-XL) 50 MG 24 hr tablet Take 1 tablet (50 mg total) by mouth daily. Take with or immediately following a meal. 90 tablet 3  . Propylene Glycol (SYSTANE BALANCE) 0.6 % SOLN Apply 1 drop to eye at bedtime.     Current Facility-Administered Medications  Medication Dose Route Frequency Provider Last Rate Last Dose  . 0.9 %  sodium chloride infusion  500 mL Intravenous Continuous Irene Shipper, MD        Allergies  Allergen Reactions  . Asa Arthritis Strength-Antacid [Aspirin Buffered] Swelling    angioedema  . Penicillins Rash     Past Medical History:  Diagnosis Date  . Allergy   . Atrial tachycardia ([place])   . Blood transfusion 1944  . Cataract   . Diverticulosis   . Heart murmur   . Hemorrhoids   . Hyperlipemia   . Prostate cancer Patrick [name])    Gleason 6 status post radiation seed implant September 2010  . Rheumatic heart disease    77yrs old    Past Surgical History:  Procedure Laterality Date  . CARDIAC ELECTROPHYSIOLOGY STUDY AND ABLATION  2011   for SVT  . CATARACT EXTRACTION W/ INTRAOCULAR LENS IMPLANT  2009   right  . INGUINAL HERNIA REPAIR  01/2006   right  . MELANOMA EXCISION  2011   superficial resection left maxillary area/ Dr [name]  . TONSILLECTOMY AND ADENOIDECTOMY  1945  . TOTAL KNEE ARTHROPLASTY  1/11   s/p left  . WRIST FUSION  2006   04/2004    Social History   Socioeconomic History  . Marital status: Married    Spouse name: Not on file  . Number of children: Not on file  . Years of education: Not on file  . Highest education level: Not on file  Occupational History  . Occupation: Retired    Fish farm manager: [employer]  Social Needs  . Financial resource strain: Not on file  . Food insecurity:    Worry: Not on file    Inability: Not on file  . Transportation needs:    Medical: Not on file    Non-medical: Not on file  Tobacco Use  . Smoking status: Never Smoker  . Smokeless tobacco: Never Used  Substance and Sexual Activity  . Alcohol use: No  . Drug use: No  . Sexual  activity: Not Currently  Lifestyle  . Physical activity:    Days per week: Not on file    Minutes per session: Not on file  . Stress: Not on file  Relationships  . Social connections:    Talks on phone: Not on file    Gets together: Not on file    Attends religious service: Not on file    Active member of club or organization: Not on file    Attends meetings of clubs or organizations: Not on file    Relationship status: Not on file  . Intimate partner violence:    Fear of current or ex partner: Not on file    Emotionally abused: Not on file    Physically abused: Not on file    Forced sexual activity: Not on file  Other Topics Concern  . Not on file  Social History Narrative   Regular exercise-Yes   Married    Lives in [place]    Family History  Problem Relation Age of Onset  . Arthritis Other   . Cancer Other        Breast cancer 1st degree relative<50  . Breast cancer Mother   . Breast cancer Maternal Grandmother   . Colon cancer Neg Hx   . Esophageal cancer Neg Hx   . Pancreatic cancer Neg Hx   . Rectal cancer Neg Hx   . Stomach cancer Neg Hx     ROS: no fevers or chills, productive cough, hemoptysis, dysphasia, odynophagia, melena, hematochezia, dysuria, hematuria, rash, seizure activity, orthopnea, PND, pedal edema, claudication. Remaining systems are negative.  Physical Exam:   Blood pressure 126/90, pulse (!) 155, height 6\' 2"  (1.88 m), weight 211 lb (95.7 kg).  General:  Well developed/well nourished in NAD Skin warm/dry Patient not depressed No peripheral clubbing Back-normal HEENT-normal/normal eyelids Neck supple/normal carotid upstroke bilaterally; no bruits; no JVD; no thyromegaly chest - CTA/ normal expansion CV - irregular and tachycardic Abdomen -NT/ND, no HSM, no mass, + bowel sounds, no bruit 2+ femoral pulses, no bruits Ext-no edema, chords, 2+ DP Neuro-grossly nonfocal  ECG -atrial fibrillation at a rate of 155, left anterior fascicular block.  Personally reviewed  A/P  1 paroxysmal atrial fibrillation-patient has newly diagnosed atrial fibrillation.  Also with history of atrial tachycardia with prior ablation.  Embolic risk factors include age greater than 43 and probable hypertension. CHADSvasc-3.  Add apixaban 5 mg twice daily.  Check TSH, hemoglobin and renal function.  His heart rate is elevated.  Increase metoprolol to 75 mg twice daily.  His LV function is also newly reduced likely tachycardia mediated.  I would like to reestablish sinus rhythm if possible.  I will arrange a TEE guided cardioversion.  He is scheduled to see Dr. [name] in 2 weeks and will need either an antiarrhythmic or repeat ablation.  2 history of atrial tachycardia-plan as  outlined above.  Question antiarrhythmic versus ablation.  3 cardiomyopathy-he has a newly diagnosed cardiomyopathy with severely reduced LV function.  This may be tachycardia mediated.  Increase Toprol to 75 mg daily.  I will not add an ACE inhibitor or ARB at this point as his blood pressure may not tolerate higher doses of beta-blocker and 1 of these medications at the same time.  We will add later as blood pressure allows.  Once we reestablish sinus rhythm we will likely repeat echocardiogram 8 to 12 weeks later.  If ejection fraction does not improve then he would need further evaluation including ischemia  work-up.  Check TSH.  4 possible hypertension-he states his blood pressure typically runs 145/90-95 at home.  Much improved with metoprolol and we will continue to follow.  Kirk Ruths, MD   Discussed with Dr. [name].  Best option may be to admit patient following TEE guided cardioversion for Tikosyn load.  He will review the chart and if indicated arrange for admission for Tikosyn loading after above procedure. Kirk Ruths, MD

## 2018-04-28 NOTE — Progress Notes (Addendum)
Referring-James Allred, MD Reason for referral-cardiomyopathy and atrial tachycardia  HPI: 77 year old male for evaluation of cardiomyopathy and atrial tachycardia at request of [name], MD.  Patient has had previous ablation of atrial tachycardia in 2011.  Patient seen December 2019 with complaints of palpitations.  He was found to be in atrial tachycardia.  Patient was placed on beta-blocker.  He was felt to be at increased risk for repeat ablation and history of rheumatic fever.  Echo was ordered and showed newly reduced LV function with ejection fraction 20 to 25%, mild left ventricular hypertrophy, mild mitral regurgitation.  His heart rate during the study was 120-160.  Cardiology now asked to evaluate.  Patient has had mild dyspnea on exertion which is new.  No orthopnea, PND, pedal edema, syncope or chest pain.  Occasional mild palpitations.  No history of bleeding.  Current Outpatient Medications  Medication Sig Dispense Refill  . acetaminophen (TYLENOL) 500 MG tablet Take 500 mg by mouth every 6 (six) hours as needed.    . loratadine (CLARITIN) 10 MG tablet Take 10 mg by mouth daily as needed.    . metoprolol succinate (TOPROL-XL) 50 MG 24 hr tablet Take 1 tablet (50 mg total) by mouth daily. Take with or immediately following a meal. 90 tablet 3  . Propylene Glycol (SYSTANE BALANCE) 0.6 % SOLN Apply 1 drop to eye at bedtime.     Current Facility-Administered Medications  Medication Dose Route Frequency Provider Last Rate Last Dose  . 0.9 %  sodium chloride infusion  500 mL Intravenous Continuous Irene Shipper, MD        Allergies  Allergen Reactions  . Asa Arthritis Strength-Antacid [Aspirin Buffered] Swelling    angioedema  . Penicillins Rash     Past Medical History:  Diagnosis Date  . Allergy   . Atrial tachycardia ([place])   . Blood transfusion 1944  . Cataract   . Diverticulosis   . Heart murmur   . Hemorrhoids   . Hyperlipemia   . Prostate cancer [facility])    Gleason 6 status post radiation seed implant September 2010  . Rheumatic heart disease    77yrs old    Past Surgical History:  Procedure Laterality Date  . CARDIAC ELECTROPHYSIOLOGY STUDY AND ABLATION  2011   for SVT  . CATARACT EXTRACTION W/ INTRAOCULAR LENS IMPLANT  2009   right  . INGUINAL HERNIA REPAIR  01/2006   right  . MELANOMA EXCISION  2011   superficial resection left maxillary area/ Dr [name]  . TONSILLECTOMY AND ADENOIDECTOMY  1945  . TOTAL KNEE ARTHROPLASTY  1/11   s/p left  . WRIST FUSION  2006   04/2004    Social History   Socioeconomic History  . Marital status: Married    Spouse name: Not on file  . Number of children: Not on file  . Years of education: Not on file  . Highest education level: Not on file  Occupational History  . Occupation: Retired    Fish farm manager: [employer]  Social Needs  . Financial resource strain: Not on file  . Food insecurity:    Worry: Not on file    Inability: Not on file  . Transportation needs:    Medical: Not on file    Non-medical: Not on file  Tobacco Use  . Smoking status: Never Smoker  . Smokeless tobacco: Never Used  Substance and Sexual Activity  . Alcohol use: No  . Drug use: No  . Sexual  activity: Not Currently  Lifestyle  . Physical activity:    Days per week: Not on file    Minutes per session: Not on file  . Stress: Not on file  Relationships  . Social connections:    Talks on phone: Not on file    Gets together: Not on file    Attends religious service: Not on file    Active member of club or organization: Not on file    Attends meetings of clubs or organizations: Not on file    Relationship status: Not on file  . Intimate partner violence:    Fear of current or ex partner: Not on file    Emotionally abused: Not on file    Physically abused: Not on file    Forced sexual activity: Not on file  Other Topics Concern  . Not on file  Social History Narrative   Regular exercise-Yes   Married    Lives in [place]    Family History  Problem Relation Age of Onset  . Arthritis Other   . Cancer Other        Breast cancer 1st degree relative<50  . Breast cancer Mother   . Breast cancer Maternal Grandmother   . Colon cancer Neg Hx   . Esophageal cancer Neg Hx   . Pancreatic cancer Neg Hx   . Rectal cancer Neg Hx   . Stomach cancer Neg Hx     ROS: no fevers or chills, productive cough, hemoptysis, dysphasia, odynophagia, melena, hematochezia, dysuria, hematuria, rash, seizure activity, orthopnea, PND, pedal edema, claudication. Remaining systems are negative.  Physical Exam:   Blood pressure 126/90, pulse (!) 155, height 6\' 2"  (1.88 m), weight 211 lb (95.7 kg).  General:  Well developed/well nourished in NAD Skin warm/dry Patient not depressed No peripheral clubbing Back-normal HEENT-normal/normal eyelids Neck supple/normal carotid upstroke bilaterally; no bruits; no JVD; no thyromegaly chest - CTA/ normal expansion CV - irregular and tachycardic Abdomen -NT/ND, no HSM, no mass, + bowel sounds, no bruit 2+ femoral pulses, no bruits Ext-no edema, chords, 2+ DP Neuro-grossly nonfocal  ECG -atrial fibrillation at a rate of 155, left anterior fascicular block.  Personally reviewed  A/P  1 paroxysmal atrial fibrillation-patient has newly diagnosed atrial fibrillation.  Also with history of atrial tachycardia with prior ablation.  Embolic risk factors include age greater than 69 and probable hypertension. CHADSvasc-3.  Add apixaban 5 mg twice daily.  Check TSH, hemoglobin and renal function.  His heart rate is elevated.  Increase metoprolol to 75 mg twice daily.  His LV function is also newly reduced likely tachycardia mediated.  I would like to reestablish sinus rhythm if possible.  I will arrange a TEE guided cardioversion.  He is scheduled to see Dr. [name] in 2 weeks and will need either an antiarrhythmic or repeat ablation.  2 history of atrial tachycardia-plan as  outlined above.  Question antiarrhythmic versus ablation.  3 cardiomyopathy-he has a newly diagnosed cardiomyopathy with severely reduced LV function.  This may be tachycardia mediated.  Increase Toprol to 75 mg daily.  I will not add an ACE inhibitor or ARB at this point as his blood pressure may not tolerate higher doses of beta-blocker and 1 of these medications at the same time.  We will add later as blood pressure allows.  Once we reestablish sinus rhythm we will likely repeat echocardiogram 8 to 12 weeks later.  If ejection fraction does not improve then he would need further evaluation including ischemia  work-up.  Check TSH.  4 possible hypertension-he states his blood pressure typically runs 145/90-95 at home.  Much improved with metoprolol and we will continue to follow.  Kirk Ruths, MD   Discussed with Dr. [name].  Best option may be to admit patient following TEE guided cardioversion for Tikosyn load.  He will review the chart and if indicated arrange for admission for Tikosyn loading after above procedure. Kirk Ruths, MD

## 2018-04-30 ENCOUNTER — Telehealth: Payer: Self-pay | Admitting: Cardiology

## 2018-04-30 NOTE — Telephone Encounter (Signed)
Pt has never been seen by Dr. Stanford Breed.

## 2018-04-30 NOTE — Telephone Encounter (Signed)
° ° °  Patient calling with HR concerns. Please call  1) What is your heart rate? 118  2) Do you have a log of your heart rate readings (document readings)? 118, 123  3) Do you have any other symptoms? no

## 2018-04-30 NOTE — Telephone Encounter (Signed)
Patient stated on December 30th and 31st his HR stayed below 100, and was in the 80's. The last two days patient stated his heart has been racing and running between 116 to 144 bpm. Patient is also complaining of SOB with exertion and  having trouble sleeping at night due to being too quiet and he can hear his heart beating. Informed patient that a message would be sent to Dr. [name] for advisement. Patient has not seen Dr. Stanford Breed yet, but has a new patient appt on 05/11/18.

## 2018-05-01 NOTE — Telephone Encounter (Signed)
Returned call to Pt.  Advised Pt try taking his Toprol XL at night time to help him rest and hopefully reduce his ability to feel his heart beat at night.  Advised if Pt continued to have elevated heart rates to call this nurse back on Monday to discuss increasing BB.  Pt reports BP 130/89, 120/90  Advised plenty of room with BP to titrate BB's.  Pt indicates understanding, thanked nurse for return call.

## 2018-05-04 ENCOUNTER — Telehealth: Payer: Self-pay | Admitting: Internal Medicine

## 2018-05-04 NOTE — Telephone Encounter (Signed)
New Message:   Patient calling concerning about a medication change. Please call patient.

## 2018-05-07 NOTE — Telephone Encounter (Signed)
Call returned to Pt.  Pt had tried taking Toprol xL 50 mg at bedtime, but this has not helped his symptoms.  Continues to have elevated heart rates--120, 158  Will discuss with Dr. [name] and return call to Pt.

## 2018-05-08 NOTE — Telephone Encounter (Signed)
Returned call to Pt.  Per Dr. Benancio Deeds changes at this time.  Follow up with Dr. Stanford Breed as scheduled for 05/11/2018.  Pt indicates understanding.  Advised to call after hours if any issues between now and Monday.

## 2018-05-11 ENCOUNTER — Encounter: Payer: Self-pay | Admitting: Cardiology

## 2018-05-11 ENCOUNTER — Ambulatory Visit (INDEPENDENT_AMBULATORY_CARE_PROVIDER_SITE_OTHER): Payer: Medicare Other | Admitting: Cardiology

## 2018-05-11 VITALS — BP 126/90 | HR 155 | Ht 74.0 in | Wt 211.0 lb

## 2018-05-11 DIAGNOSIS — I428 Other cardiomyopathies: Secondary | ICD-10-CM | POA: Diagnosis not present

## 2018-05-11 DIAGNOSIS — I10 Essential (primary) hypertension: Secondary | ICD-10-CM | POA: Diagnosis not present

## 2018-05-11 DIAGNOSIS — I4819 Other persistent atrial fibrillation: Secondary | ICD-10-CM

## 2018-05-11 MED ORDER — APIXABAN 5 MG PO TABS: 5.0000 mg | ORAL_TABLET | Freq: Two times a day (BID) | ORAL | 6 refills | Status: DC

## 2018-05-11 MED ORDER — METOPROLOL SUCCINATE ER 25 MG PO TB24: 25.0000 mg | ORAL_TABLET | Freq: Every day | ORAL | 3 refills | Status: DC

## 2018-05-11 NOTE — Patient Instructions (Addendum)
Medication Instructions:  INCREASE METOPEOLOL SUCC TO 75 MG ONCE DAILY= 1 OF THE 50 MG TABLETS AND 1 OF THE 25 MG TABLET ONCE DAILY  START ELIQUIS 5 MG ONE TABLET TWICE DAILY If you need a refill on your cardiac medications before your next appointment, please call your pharmacy.   Lab work: Your physician recommends that you HAVE LAB WORK TODAY If you have labs (blood work) drawn today and your tests are completely normal, you will receive your results only by: Marland Kitchen MyChart Message (if you have MyChart) OR . A paper copy in the mail If you have any lab test that is abnormal or we need to change your treatment, we will call you to review the results.  Testing/Procedures: You are scheduled for a TEE Cardioversion on 05-18-18 with Dr. Harrell Gave.  Please arrive at the [facility] (Main Entrance A) at [facility]: [address] [place], [place] [address] at 9:30 am. (1 hour prior to procedure unless lab work is needed; if lab work is needed arrive 1.5 hours ahead)  DIET: Nothing to eat or drink after midnight except a sip of water with medications (see medication instructions below)  Medication Instructions:  Continue your anticoagulant: ELIQUIS You will need to continue your anticoagulant after your procedure until you are told by your  Provider that it is safe to stop  You must have a responsible person to drive you home and stay in the waiting area during your procedure. Failure to do so could result in cancellation.  Bring your insurance cards.  *Special Note: Every effort is made to have your procedure done on time. Occasionally there are emergencies that occur at the hospital that may cause delays. Please be patient if a delay does occur.     Follow-Up: At [facility], you and your health needs are our priority.  As part of our continuing mission to provide you with exceptional heart care, we have created designated Provider Care Teams.  These Care Teams include your  primary Cardiologist (physician) and Advanced Practice Providers (APPs -  Physician Assistants and Nurse Practitioners) who all work together to provide you with the care you need, when you need it.  Your physician recommends that you schedule a follow-up appointment in: [place]  Your physician recommends that you schedule a follow-up appointment in: WITH DR ALLRED AS SCHEDULED

## 2018-05-12 ENCOUNTER — Telehealth: Payer: Self-pay | Admitting: Pharmacist

## 2018-05-12 LAB — CBC
HEMOGLOBIN: 14.7 g/dL (ref 13.0–17.7)
Hematocrit: 43.9 % (ref 37.5–51.0)
MCH: 32.8 pg (ref 26.6–33.0)
MCHC: 33.5 g/dL (ref 31.5–35.7)
MCV: 98 fL — ABNORMAL HIGH (ref 79–97)
Platelets: 178 10*3/uL (ref 150–450)
RBC: 4.48 x10E6/uL (ref 4.14–5.80)
RDW: 12.4 % (ref 11.6–15.4)
WBC: 5.9 10*3/uL (ref 3.4–10.8)

## 2018-05-12 LAB — BASIC METABOLIC PANEL
BUN/Creatinine Ratio: 10 (ref 10–24)
BUN: 12 mg/dL (ref 8–27)
CO2: 23 mmol/L (ref 20–29)
CREATININE: 1.18 mg/dL (ref 0.76–1.27)
Calcium: 9.6 mg/dL (ref 8.6–10.2)
Chloride: 101 mmol/L (ref 96–106)
GFR calc Af Amer: 69 mL/min/{1.73_m2} (ref >59)
GFR calc non Af Amer: 60 mL/min/{1.73_m2} (ref >59)
Glucose: 103 mg/dL — ABNORMAL HIGH (ref 65–99)
Potassium: 4.7 mmol/L (ref 3.5–5.2)
Sodium: 140 mmol/L (ref 134–144)

## 2018-05-12 LAB — TSH: TSH: 3.97 u[IU]/mL (ref 0.450–4.500)

## 2018-05-12 NOTE — Telephone Encounter (Signed)
Medication list reviewed in anticipation of upcoming Tikosyn initiation. Patient is not taking any contraindicated or QTc prolonging medications. Potassium was >4.0 on check yesterday. No mag on file.   Patient is anticoagulated on Eliquis on the appropriate dose. Pt will have TEE prior to Tikosyn loading as was started on anticoagulation yesterday and has not had 3 weeks of therapeutic anticoagulation yet.   Patient will need to be counseled to avoid use of Benadryl while on Tikosyn and in the 2-3 days prior to Tikosyn initiation.

## 2018-05-13 ENCOUNTER — Telehealth (HOSPITAL_COMMUNITY): Payer: Self-pay | Admitting: *Deleted

## 2018-05-13 NOTE — Telephone Encounter (Signed)
Called to discuss tikosyn admission with patient after TEE/DCCV on 1/20 - patient does not want to take tikosyn as his wife nearly died from v-fib arrest related to [place] about 5 weeks after being discharged from the hospital. He would like to further discuss other options at his appt with Dr. [name] on 1/29. Instructed pt to proceed with TEE/DCCV as already scheduled and I will forward notes to Dr. Stanford Breed and Dr. [name] so they are aware.

## 2018-05-13 NOTE — Telephone Encounter (Signed)
Patient will not be going forward with tikosyn admission.

## 2018-05-18 ENCOUNTER — Encounter (HOSPITAL_COMMUNITY): Payer: Self-pay | Admitting: Cardiology

## 2018-05-18 ENCOUNTER — Ambulatory Visit (HOSPITAL_BASED_OUTPATIENT_CLINIC_OR_DEPARTMENT_OTHER): Payer: Medicare Other

## 2018-05-18 ENCOUNTER — Ambulatory Visit (HOSPITAL_COMMUNITY): Payer: Medicare Other | Admitting: Anesthesiology

## 2018-05-18 ENCOUNTER — Ambulatory Visit (HOSPITAL_COMMUNITY)
Admission: RE | Admit: 2018-05-18 | Discharge: 2018-05-18 | Disposition: A | Discharge status: Home / Self Care | Payer: Medicare Other | Attending: Cardiology | Admitting: Cardiology

## 2018-05-18 ENCOUNTER — Encounter (HOSPITAL_COMMUNITY)
Admission: RE | Disposition: A | Discharge status: Home / Self Care | Payer: Self-pay | Source: Home / Self Care | Attending: Cardiology

## 2018-05-18 DIAGNOSIS — I081 Rheumatic disorders of both mitral and tricuspid valves: Secondary | ICD-10-CM | POA: Insufficient documentation

## 2018-05-18 DIAGNOSIS — I471 Supraventricular tachycardia: Secondary | ICD-10-CM | POA: Insufficient documentation

## 2018-05-18 DIAGNOSIS — Z886 Allergy status to analgesic agent status: Secondary | ICD-10-CM | POA: Insufficient documentation

## 2018-05-18 DIAGNOSIS — I480 Paroxysmal atrial fibrillation: Secondary | ICD-10-CM | POA: Diagnosis not present

## 2018-05-18 DIAGNOSIS — Z79899 Other long term (current) drug therapy: Secondary | ICD-10-CM | POA: Diagnosis not present

## 2018-05-18 DIAGNOSIS — I4819 Other persistent atrial fibrillation: Secondary | ICD-10-CM | POA: Diagnosis not present

## 2018-05-18 DIAGNOSIS — I4891 Unspecified atrial fibrillation: Secondary | ICD-10-CM

## 2018-05-18 DIAGNOSIS — E785 Hyperlipidemia, unspecified: Secondary | ICD-10-CM | POA: Insufficient documentation

## 2018-05-18 DIAGNOSIS — R0609 Other forms of dyspnea: Secondary | ICD-10-CM | POA: Insufficient documentation

## 2018-05-18 DIAGNOSIS — Z880 Allergy status to penicillin: Secondary | ICD-10-CM | POA: Diagnosis not present

## 2018-05-18 DIAGNOSIS — I428 Other cardiomyopathies: Secondary | ICD-10-CM | POA: Diagnosis not present

## 2018-05-18 DIAGNOSIS — Z7901 Long term (current) use of anticoagulants: Secondary | ICD-10-CM | POA: Diagnosis not present

## 2018-05-18 DIAGNOSIS — R030 Elevated blood-pressure reading, without diagnosis of hypertension: Secondary | ICD-10-CM | POA: Diagnosis not present

## 2018-05-18 DIAGNOSIS — I340 Nonrheumatic mitral (valve) insufficiency: Secondary | ICD-10-CM

## 2018-05-18 HISTORY — PX: TEE WITHOUT CARDIOVERSION: SHX5443

## 2018-05-18 HISTORY — PX: CARDIOVERSION: SHX1299

## 2018-05-18 SURGERY — ECHOCARDIOGRAM, TRANSESOPHAGEAL
Anesthesia: Monitor Anesthesia Care

## 2018-05-18 MED ORDER — SODIUM CHLORIDE 0.9 % IV SOLN: 250.0000 mL | INTRAVENOUS | Status: DC

## 2018-05-18 MED ORDER — PROPOFOL 500 MG/50ML IV EMUL
INTRAVENOUS | Status: DC | PRN
  Administered 2018-05-18: 75 ug/kg/min via INTRAVENOUS

## 2018-05-18 MED ORDER — PHENYLEPHRINE 40 MCG/ML (10ML) SYRINGE FOR IV PUSH (FOR BLOOD PRESSURE SUPPORT)
PREFILLED_SYRINGE | INTRAVENOUS | Status: DC | PRN
  Administered 2018-05-18: 80 ug via INTRAVENOUS

## 2018-05-18 MED ORDER — LACTATED RINGERS IV SOLN
INTRAVENOUS | Status: DC
  Administered 2018-05-18: 10:00:00 via INTRAVENOUS

## 2018-05-18 MED ORDER — SODIUM CHLORIDE 0.9% FLUSH: 3.0000 mL | Freq: Two times a day (BID) | INTRAVENOUS | Status: DC

## 2018-05-18 MED ORDER — PROPOFOL 10 MG/ML IV BOLUS
INTRAVENOUS | Status: DC | PRN
  Administered 2018-05-18: 30 mg via INTRAVENOUS
  Administered 2018-05-18: 20 mg via INTRAVENOUS

## 2018-05-18 MED ORDER — LIDOCAINE 2% (20 MG/ML) 5 ML SYRINGE
INTRAMUSCULAR | Status: DC | PRN
  Administered 2018-05-18: 60 mg via INTRAVENOUS

## 2018-05-18 MED ORDER — SODIUM CHLORIDE 0.9% FLUSH: 3.0000 mL | INTRAVENOUS | Status: DC | PRN

## 2018-05-18 MED ORDER — SODIUM CHLORIDE 0.9 % IV SOLN: INTRAVENOUS | Status: DC

## 2018-05-18 NOTE — Discharge Instructions (Signed)
TEE ° °YOU HAD AN CARDIAC PROCEDURE TODAY: Refer to the procedure report and other information in the discharge instructions given to you for any specific questions about what was found during the examination. If this information does not answer your questions, please call [facility] office at [phone] to clarify.  ° °DIET: Your first meal following the procedure should be a light meal and then it is ok to progress to your normal diet. A half-sandwich or bowl of soup is an example of a good first meal. Heavy or fried foods are harder to digest and may make you feel nauseous or bloated. Drink plenty of fluids but you should avoid alcoholic beverages for 24 hours. If you had a esophageal dilation, please see attached instructions for diet.  ° °ACTIVITY: Your care partner should take you home directly after the procedure. You should plan to take it easy, moving slowly for the rest of the day. You can resume normal activity the day after the procedure however YOU SHOULD NOT DRIVE, use power tools, machinery or perform tasks that involve climbing or major physical exertion for 24 hours (because of the sedation medicines used during the test).  ° °SYMPTOMS TO REPORT IMMEDIATELY: °A cardiologist can be reached at any hour. Please call [phone] for any of the following symptoms:  °Vomiting of blood or coffee ground material  °New, significant abdominal pain  °New, significant chest pain or pain under the shoulder blades  °Painful or persistently difficult swallowing  °New shortness of breath  °Black, tarry-looking or red, bloody stools ° °FOLLOW UP:  °Please also call with any specific questions about appointments or follow up tests. ° °Electrical Cardioversion, Care After °This sheet gives you information about how to care for yourself after your procedure. Your health care provider may also give you more specific instructions. If you have problems or questions, contact your health care provider. °What can I  expect after the procedure? °After the procedure, it is common to have: °· Some redness on the skin where the shocks were given. °Follow these instructions at home: ° °· Do not drive for 24 hours if you were given a medicine to help you relax (sedative). °· Take over-the-counter and prescription medicines only as told by your health care provider. °· Ask your health care provider how to check your pulse. Check it often. °· Rest for 48 hours after the procedure or as told by your health care provider. °· Avoid or limit your caffeine use as told by your health care provider. °Contact a health care provider if: °· You feel like your heart is beating too quickly or your pulse is not regular. °· You have a serious muscle cramp that does not go away. °Get help right away if: ° °· You have discomfort in your chest. °· You are dizzy or you feel faint. °· You have trouble breathing or you are short of breath. °· Your speech is slurred. °· You have trouble moving an arm or leg on one side of your body. °· Your fingers or toes turn cold or blue. °This information is not intended to replace advice given to you by your health care provider. Make sure you discuss any questions you have with your health care provider. °Document Released: 02/03/2013 Document Revised: 11/17/2015 Document Reviewed: 10/20/2015 °Elsevier Interactive Patient Education © 2019 [employer] Inc[employer] ° °

## 2018-05-18 NOTE — Transfer of Care (Signed)
Immediate Anesthesia Transfer of Care Note  Patient: Patrick [name]  Procedure(s) Performed: TRANSESOPHAGEAL ECHOCARDIOGRAM (TEE) (N/A ) CARDIOVERSION (N/A )  Patient Location: PACU and Endoscopy Unit  Anesthesia Type:MAC  Level of Consciousness: awake and drowsy  Airway & Oxygen Therapy: Patient Spontanous Breathing  Post-op Assessment: Report given to RN and Post -op Vital signs reviewed and stable  Post vital signs: Reviewed and stable  Last Vitals:  Vitals Value Taken Time  BP    Temp    Pulse    Resp    SpO2      Last Pain:  Vitals:   05/18/18 0935  TempSrc: Oral  PainSc: 0-No pain         Complications: No apparent anesthesia complications

## 2018-05-18 NOTE — CV Procedure (Signed)
   TRANSESOPHAGEAL ECHOCARDIOGRAM GUIDED DIRECT CURRENT CARDIOVERSION  NAME:  Patrick [name]   MRN: [id number] DOB:  1941-01-25   ADMIT DATE: 05/18/2018  INDICATIONS: Symptomatic atrial fibrillation with RVR  PROCEDURE:   Informed consent was obtained prior to the procedure. The risks, benefits and alternatives for the procedure were discussed and the patient comprehended these risks.  Risks include, but are not limited to, cough, sore throat, vomiting, nausea, somnolence, esophageal and stomach trauma or perforation, bleeding, low blood pressure, aspiration, pneumonia, infection, trauma to the teeth and death.    After a procedural time-out, the patient was sedated by the anesthesia service. The transesophageal probe was inserted in the esophagus and stomach without difficulty and multiple views were obtained.   FINDINGS:  LEFT VENTRICLE: EF = 20-25% with diffuse hypokinesis.  RIGHT VENTRICLE: Normal size and moderate diffuse hypokinesis.   LEFT ATRIUM: No thrombus/mass.  LEFT ATRIAL APPENDAGE: No thrombus/mass.   RIGHT ATRIUM: No thrombus/mass.  AORTIC VALVE:  Trileaflet. No regurgitation. No vegetation.  MITRAL VALVE:    Normal structure. Mild regurgitation. No vegetation.  TRICUSPID VALVE: Normal structure. Mild-moderate regurgitation. No vegetation.  PULMONIC VALVE: Grossly normal structure. No regurgitation. No apparent vegetation.  INTERATRIAL SEPTUM: No PFO or ASD seen by color Doppler.  PERICARDIUM: No effusion noted.  DESCENDING AORTA: Mild diffuse plaque seen   CARDIOVERSION:     Indications:  Symptomatic Atrial Fibrillation  Procedure Details:  Once the TEE was complete, the patient had the defibrillator pads placed in the anterior and posterior position. Once an appropriate level of sedation was confirmed, the patient was cardioverted x 1 with 120 J of biphasic synchronized energy.  The patient converted to NSR.  There were no apparent complications.  The  patient had normal neuro status and respiratory status post procedure with vitals stable as recorded elsewhere.  Adequate airway was maintained throughout and vital signs monitored per protocol.  Buford Dresser, MD, PhD [facility]  [address], [place], [place] [address] [phone]   10:33 AM

## 2018-05-18 NOTE — Interval H&P Note (Signed)
History and Physical Interval Note:  05/18/2018 10:10 AM  Patrick [name]  has presented today for surgery, with the diagnosis of AFIB  The various methods of treatment have been discussed with the patient and family. After consideration of risks, benefits and other options for treatment, the patient has consented to  Procedure(s): TRANSESOPHAGEAL ECHOCARDIOGRAM (TEE) (N/A) CARDIOVERSION (N/A) as a surgical intervention .  The patient's history has been reviewed, patient examined, no change in status, stable for surgery.  I have reviewed the patient's chart and labs.  Questions were answered to the patient's satisfaction.     Moody Robben Harrell Gave

## 2018-05-18 NOTE — Anesthesia Procedure Notes (Signed)
Date/Time: 05/18/2018 10:12 AM Performed by: Trinna Post., CRNA Pre-anesthesia Checklist: Patient identified, Emergency Drugs available, Suction available, Patient being monitored and Timeout performed Patient Re-evaluated:Patient Re-evaluated prior to induction Oxygen Delivery Method: Nasal cannula Preoxygenation: Pre-oxygenation with 100% oxygen Induction Type: IV induction Placement Confirmation: positive ETCO2

## 2018-05-18 NOTE — Anesthesia Postprocedure Evaluation (Signed)
Anesthesia Post Note  Patient: Patrick [name]  Procedure(s) Performed: TRANSESOPHAGEAL ECHOCARDIOGRAM (TEE) (N/A ) CARDIOVERSION (N/A )     Patient location during evaluation: Endoscopy Anesthesia Type: MAC Level of consciousness: awake and alert Pain management: pain level controlled Vital Signs Assessment: post-procedure vital signs reviewed and stable Respiratory status: spontaneous breathing, nonlabored ventilation, respiratory function stable and patient connected to nasal cannula oxygen Cardiovascular status: blood pressure returned to baseline and stable Postop Assessment: no apparent nausea or vomiting Anesthetic complications: no    Last Vitals:  Vitals:   05/18/18 0935 05/18/18 1040  BP: (!) 161/118 (!) 99/59  Pulse: (!) 144 70  Resp: 18 17  Temp: 37 C 36.6 C  SpO2: 97% 95%    Last Pain:  Vitals:   05/18/18 1040  TempSrc: Oral  PainSc: 0-No pain                 Barnet Glasgow

## 2018-05-18 NOTE — Anesthesia Preprocedure Evaluation (Addendum)
Anesthesia Evaluation  Patient identified by MRN, date of birth, ID band Patient awake    Reviewed: Allergy & Precautions, NPO status , Patient's Chart, lab work & pertinent test results  Airway Mallampati: II  TM Distance: >3 FB     Dental  (+) Teeth Intact, Dental Advisory Given   Pulmonary neg pulmonary ROS,    breath sounds clear to auscultation       Cardiovascular Exercise Tolerance: Good negative cardio ROS   Rhythm:Irregular Rate:Abnormal     Neuro/Psych negative neurological ROS  negative psych ROS   GI/Hepatic Neg liver ROS,   Endo/Other  negative endocrine ROS  Renal/GU negative Renal ROS     Musculoskeletal negative musculoskeletal ROS (+)   Abdominal   Peds  Hematology negative hematology ROS (+)   Anesthesia Other Findings   Reproductive/Obstetrics                            Anesthesia Physical Anesthesia Plan  ASA: III  Anesthesia Plan: General   Post-op Pain Management:    Induction: Intravenous  PONV Risk Score and Plan: Ondansetron and Propofol infusion  Airway Management Planned:   Additional Equipment:   Intra-op Plan:   Post-operative Plan:   Informed Consent: I have reviewed the patients History and Physical, chart, labs and discussed the procedure including the risks, benefits and alternatives for the proposed anesthesia with the patient or authorized representative who has indicated his/her understanding and acceptance.     Dental advisory given  Plan Discussed with: CRNA and Anesthesiologist  Anesthesia Plan Comments:         Anesthesia Quick Evaluation

## 2018-05-18 NOTE — Progress Notes (Signed)
  Echocardiogram Transesophageal has been performed.  Matilde Bash 05/18/2018, 10:44 AM

## 2018-05-19 NOTE — Addendum Note (Signed)
Addended by: Venetia Maxon on: 05/19/2018 09:31 AM   Modules accepted: Orders

## 2018-05-20 ENCOUNTER — Encounter (HOSPITAL_COMMUNITY): Payer: Self-pay | Admitting: Cardiology

## 2018-05-20 ENCOUNTER — Telehealth: Payer: Self-pay

## 2018-05-20 NOTE — Telephone Encounter (Signed)
Called patient about recent email.He stated he had a cardioversion this past Mon 1/20.Stated he thinks heart may be out of rhythm.Stated today he noticed heart beat 130.Stated he feels ok.He is taking all medications as prescribed.Advised office is currently closed.Nurse visit appointment scheduled tomorrow at 8:30 am for EKG.Advised to go to ED if needed.

## 2018-05-21 ENCOUNTER — Encounter: Payer: Self-pay | Admitting: Cardiology

## 2018-05-21 ENCOUNTER — Other Ambulatory Visit: Payer: Self-pay

## 2018-05-21 ENCOUNTER — Ambulatory Visit: Payer: Medicare Other

## 2018-05-21 ENCOUNTER — Ambulatory Visit (INDEPENDENT_AMBULATORY_CARE_PROVIDER_SITE_OTHER): Payer: Medicare Other | Admitting: Cardiology

## 2018-05-21 VITALS — BP 130/82 | HR 137 | Ht 74.0 in | Wt 205.0 lb

## 2018-05-21 DIAGNOSIS — I420 Dilated cardiomyopathy: Secondary | ICD-10-CM

## 2018-05-21 DIAGNOSIS — I4819 Other persistent atrial fibrillation: Secondary | ICD-10-CM | POA: Diagnosis not present

## 2018-05-21 DIAGNOSIS — I10 Essential (primary) hypertension: Secondary | ICD-10-CM

## 2018-05-21 MED ORDER — AMIODARONE HCL 200 MG PO TABS: ORAL_TABLET | ORAL | 6 refills | Status: DC

## 2018-05-21 MED ORDER — METOPROLOL SUCCINATE ER 100 MG PO TB24: 100.0000 mg | ORAL_TABLET | Freq: Every day | ORAL | 3 refills | Status: DC

## 2018-05-21 NOTE — Patient Instructions (Addendum)
Medication Instructions:   Increase Metoprolol to 100 mg daily  Start Amiodarone 200 mg twice a day for 2 weeks then 200 mg daily If you need a refill on your cardiac medications before your next appointment, please call your pharmacy.   Lab work: None ordered   Testing/Procedures: None Ordered  Follow-Up: At [facility], you and your health needs are our priority.  As part of our continuing mission to provide you with exceptional heart care, we have created designated Provider Care Teams.  These Care Teams include your primary Cardiologist (physician) and Advanced Practice Providers (APPs -  Physician Assistants and Nurse Practitioners) who all work together to provide you with the care you need, when you need it. Marland Kitchen Keep appointment with Dr.Allred Wed 05/27/18 at 9:00 am . Keep appointment with Dr.Crenshaw Mon 06/08/18 at 2:40 pm

## 2018-05-21 NOTE — Progress Notes (Signed)
HPI: FU CM and atrial fibrillation.  Patient has had previous ablation of atrial tachycardia in 2011.  Patient seen December 2019 with complaints of palpitations.  He was found to be in atrial tachycardia.  Patient was placed on beta-blocker.  He was felt to be at increased risk for repeat ablation given history of rheumatic fever.  Echo was ordered and showed newly reduced LV function with ejection fraction 20 to 25%, mild left ventricular hypertrophy, mild mitral regurgitation.  His heart rate during the study was 120-160.  Had TEE on May 18, 2018.  Ejection fraction 20 to 25%, mild mitral regurgitation, no left atrial appendage thrombus.  Patient successfully cardioverted.  Note Dr. [name] had planned to admit afterwards and begin Tikosyn but patient declined as his wife had a cardiac arrest on Tikosyn.  Contacted the office and added to my schedule today because of recurrent palpitations.  Found to be in atrial fibrillation.  Since developing recurrent atrial fibrillation he notes increased dyspnea on exertion.  There is some fatigue.  Some palpitations.  No chest pain, syncope or bleeding.  Current Outpatient Medications  Medication Sig Dispense Refill  . acetaminophen (TYLENOL) 500 MG tablet Take 500 mg by mouth every 6 (six) hours as needed for moderate pain.     Marland Kitchen apixaban (ELIQUIS) 5 MG TABS tablet Take 1 tablet (5 mg total) by mouth 2 (two) times daily. 60 tablet 6  . metoprolol succinate (TOPROL XL) 25 MG 24 hr tablet Take 1 tablet (25 mg total) by mouth daily. 90 tablet 3  . metoprolol succinate (TOPROL-XL) 50 MG 24 hr tablet Take 1 tablet (50 mg total) by mouth daily. Take with or immediately following a meal. 90 tablet 3  . Propylene Glycol (SYSTANE BALANCE) 0.6 % SOLN Apply 1 drop to eye at bedtime.     No current facility-administered medications for this visit.      Past Medical History:  Diagnosis Date  . Allergy   . Atrial tachycardia ([place])   . Blood transfusion  1944  . Cataract   . Diverticulosis   . Heart murmur   . Hemorrhoids   . Hyperlipemia   . Prostate cancer [facility])    Gleason 6 status post radiation seed implant September 2010  . Rheumatic heart disease    78yrs old    Past Surgical History:  Procedure Laterality Date  . CARDIAC ELECTROPHYSIOLOGY STUDY AND ABLATION  2011   for SVT  . CARDIOVERSION N/A 05/18/2018   Procedure: CARDIOVERSION;  Surgeon: Buford Dresser, MD;  Location: [place];  Service: Cardiovascular;  Laterality: N/A;  . CATARACT EXTRACTION W/ INTRAOCULAR LENS IMPLANT  2009   right  . INGUINAL HERNIA REPAIR  01/2006   right  . MELANOMA EXCISION  2011   superficial resection left maxillary area/ Dr [name]  . TEE WITHOUT CARDIOVERSION N/A 05/18/2018   Procedure: TRANSESOPHAGEAL ECHOCARDIOGRAM (TEE);  Surgeon: Buford Dresser, MD;  Location: [facility] ENDOSCOPY;  Service: Cardiovascular;  Laterality: N/A;  . [place]  . TOTAL KNEE ARTHROPLASTY  1/11   s/p left  . WRIST FUSION  2006   04/2004    Social History   Socioeconomic History  . Marital status: Married    Spouse name: Not on file  . Number of children: Not on file  . Years of education: Not on file  . Highest education level: Not on file  Occupational History  . Occupation: Retired    Fish farm manager: Sports administrator  Social Needs  . Financial resource strain: Not on file  . Food insecurity:    Worry: Not on file    Inability: Not on file  . Transportation needs:    Medical: Not on file    Non-medical: Not on file  Tobacco Use  . Smoking status: Never Smoker  . Smokeless tobacco: Never Used  Substance and Sexual Activity  . Alcohol use: No  . Drug use: No  . Sexual activity: Not Currently  Lifestyle  . Physical activity:    Days per week: Not on file    Minutes per session: Not on file  . Stress: Not on file  Relationships  . Social connections:    Talks on phone: Not on file    Gets together: Not on  file    Attends religious service: Not on file    Active member of club or organization: Not on file    Attends meetings of clubs or organizations: Not on file    Relationship status: Not on file  . Intimate partner violence:    Fear of current or ex partner: Not on file    Emotionally abused: Not on file    Physically abused: Not on file    Forced sexual activity: Not on file  Other Topics Concern  . Not on file  Social History Narrative   Regular exercise-Yes   Married   Lives in [place]    Family History  Problem Relation Age of Onset  . Arthritis Other   . Cancer Other        Breast cancer 1st degree relative<50  . Breast cancer Mother   . Breast cancer Maternal Grandmother   . Colon cancer Neg Hx   . Esophageal cancer Neg Hx   . Pancreatic cancer Neg Hx   . Rectal cancer Neg Hx   . Stomach cancer Neg Hx     ROS: no fevers or chills, productive cough, hemoptysis, dysphasia, odynophagia, melena, hematochezia, dysuria, hematuria, rash, seizure activity, orthopnea, PND, pedal edema, claudication. Remaining systems are negative.  Physical Exam: Well-developed well-nourished in no acute distress.  Skin is warm and dry.  HEENT is normal.  Neck is supple.  Chest is clear to auscultation with normal expansion.  Cardiovascular exam is irregular and tachycardic Abdominal exam nontender or distended. No masses palpated. Extremities show no edema. neuro grossly intact  ECG-atrial fibrillation with a rapid ventricular response at 137, left anterior fascicular block.  Personally reviewed  A/P  1 paroxysmal atrial fibrillation-patient has developed recurrent atrial fibrillation status post recent cardioversion.  He is symptomatic with mild increased dyspnea on exertion, fatigue and palpitations.  His heart rate is elevated.  Increase Toprol to 100 mg daily.  Continue apixaban.  He will require an antiarrhythmic to maintain sinus rhythm.  He wants to avoid Tikosyn as his  wife had a cardiac arrest on this medication.  This is certainly understandable.  We will begin amiodarone 200 mg twice daily for 2 weeks then decrease to 200 mg daily thereafter.  Plan repeat cardioversion in 4 weeks.  Hopefully he will hold sinus rhythm with the addition of amiodarone.  Recent TSH normal.  2 history of atrial tachycardia-status post prior ablation.  3 cardiomyopathy-patient has severe LV dysfunction likely tachycardia mediated.  Increase Toprol to 100 mg daily.  Plan to add amiodarone followed by cardioversion as outlined above.  Once he is in sinus rhythm (and HR improved) plan to repeat echocardiogram 3 months later.  If LV  function not improved would need ischemia evaluation.  I am not adding an ARB or Entresto at this point as we are increasing his beta-blocker for rate control and blood pressure may not tolerate higher dose of beta-blocker and ARB.  4 hypertension-patient's blood pressure is controlled.  Continue present medications and follow.  Kirk Ruths, MD

## 2018-05-25 ENCOUNTER — Emergency Department (HOSPITAL_COMMUNITY): Payer: Medicare Other

## 2018-05-25 ENCOUNTER — Emergency Department (HOSPITAL_COMMUNITY)
Admission: EM | Admit: 2018-05-25 | Discharge: 2018-05-25 | Disposition: A | Discharge status: Home / Self Care | Payer: Medicare Other | Attending: Emergency Medicine | Admitting: Emergency Medicine

## 2018-05-25 ENCOUNTER — Other Ambulatory Visit: Payer: Self-pay

## 2018-05-25 DIAGNOSIS — R079 Chest pain, unspecified: Secondary | ICD-10-CM | POA: Diagnosis not present

## 2018-05-25 DIAGNOSIS — Z7901 Long term (current) use of anticoagulants: Secondary | ICD-10-CM | POA: Insufficient documentation

## 2018-05-25 DIAGNOSIS — Z8546 Personal history of malignant neoplasm of prostate: Secondary | ICD-10-CM | POA: Insufficient documentation

## 2018-05-25 DIAGNOSIS — Z96652 Presence of left artificial knee joint: Secondary | ICD-10-CM | POA: Insufficient documentation

## 2018-05-25 DIAGNOSIS — I4891 Unspecified atrial fibrillation: Secondary | ICD-10-CM | POA: Diagnosis not present

## 2018-05-25 DIAGNOSIS — M25512 Pain in left shoulder: Secondary | ICD-10-CM | POA: Diagnosis present

## 2018-05-25 LAB — CBC
HCT: 44 % (ref 39.0–52.0)
HEMOGLOBIN: 14.1 g/dL (ref 13.0–17.0)
MCH: 31.8 pg (ref 26.0–34.0)
MCHC: 32 g/dL (ref 30.0–36.0)
MCV: 99.3 fL (ref 80.0–100.0)
Platelets: 184 10*3/uL (ref 150–400)
RBC: 4.43 MIL/uL (ref 4.22–5.81)
RDW: 13.2 % (ref 11.5–15.5)
WBC: 6.9 10*3/uL (ref 4.0–10.5)
nRBC: 0 % (ref 0.0–0.2)

## 2018-05-25 LAB — BASIC METABOLIC PANEL
Anion gap: 10 (ref 5–15)
BUN: 15 mg/dL (ref 8–23)
CHLORIDE: 104 mmol/L (ref 98–111)
CO2: 22 mmol/L (ref 22–32)
Calcium: 9.3 mg/dL (ref 8.9–10.3)
Creatinine, Ser: 1.39 mg/dL — ABNORMAL HIGH (ref 0.61–1.24)
GFR calc Af Amer: 57 mL/min — ABNORMAL LOW (ref >60)
GFR calc non Af Amer: 49 mL/min — ABNORMAL LOW (ref >60)
Glucose, Bld: 121 mg/dL — ABNORMAL HIGH (ref 70–99)
Potassium: 4.1 mmol/L (ref 3.5–5.1)
Sodium: 136 mmol/L (ref 135–145)

## 2018-05-25 LAB — TROPONIN I
Troponin I: <0.03 ng/mL (ref <0.03)
Troponin I: <0.03 ng/mL (ref <0.03)

## 2018-05-25 LAB — I-STAT TROPONIN, ED: Troponin i, poc: 0 ng/mL (ref 0.00–0.08)

## 2018-05-25 LAB — T4, FREE: Free T4: 0.83 ng/dL (ref 0.82–1.77)

## 2018-05-25 LAB — TSH: TSH: 4.713 u[IU]/mL — AB (ref 0.350–4.500)

## 2018-05-25 LAB — MAGNESIUM: MAGNESIUM: 1.8 mg/dL (ref 1.7–2.4)

## 2018-05-25 LAB — BRAIN NATRIURETIC PEPTIDE: B Natriuretic Peptide: 1048.3 pg/mL — ABNORMAL HIGH (ref 0.0–100.0)

## 2018-05-25 MED ORDER — IOPAMIDOL (ISOVUE-370) INJECTION 76%
100.0000 mL | Freq: Once | INTRAVENOUS | Status: AC | PRN
  Administered 2018-05-25: 100 mL via INTRAVENOUS

## 2018-05-25 MED ORDER — IOPAMIDOL (ISOVUE-370) INJECTION 76%
INTRAVENOUS | Status: AC
  Filled 2018-05-25: qty 100

## 2018-05-25 MED ORDER — DILTIAZEM HCL 25 MG/5ML IV SOLN
10.0000 mg | Freq: Once | INTRAVENOUS | Status: AC
  Administered 2018-05-25: 10 mg via INTRAVENOUS

## 2018-05-25 MED ORDER — DILTIAZEM HCL 25 MG/5ML IV SOLN
10.0000 mg | Freq: Once | INTRAVENOUS | Status: AC
  Administered 2018-05-25: 10 mg via INTRAVENOUS
  Filled 2018-05-25: qty 5

## 2018-05-25 MED ORDER — AMIODARONE HCL 200 MG PO TABS
200.0000 mg | ORAL_TABLET | Freq: Once | ORAL | Status: AC
  Administered 2018-05-25: 200 mg via ORAL
  Filled 2018-05-25: qty 1

## 2018-05-25 NOTE — ED Notes (Signed)
Main lab called to add on T4 and troponin I

## 2018-05-25 NOTE — ED Triage Notes (Signed)
Patient c/o irregular heart rate - recently diagnosed with Atrial Fibrillation and started taking metoprolol last week (has not taken medication this morning). Also reports L shoulder pain off and on that heat typically helps, but did not last night. Denies that pain at this time, no shortness of breath, dizziness, or chest pain.

## 2018-05-25 NOTE — ED Notes (Signed)
Patient verbalized understanding of discharge instructions and denies any further needs or questions at this time. VS stable. Patient ambulatory with steady gait.  

## 2018-05-25 NOTE — ED Provider Notes (Addendum)
[facility] EMERGENCY DEPARTMENT Provider Note   CSN: [id number] Arrival date & time: 05/25/18  1017     History   Chief Complaint Chief Complaint  Patient presents with  . Shoulder Pain  . Atrial Fibrillation    HPI Patrick [name] is a 78 y.o. male who has a past medical history of atrial fibrillation, rheumatic heart disease, hypertension, hyperlipidemia.  Patient has had previous cardioversion and ablation surgery.  On 05/18/2018 the patient had a TEE and cardioversion.  He was brought back to rhythm however went back into A. fib with RVR on 23 January.  He was seen by Dr. Stanford Breed and began taking amiodarone 200 mg twice daily and went from 50 mg of metoprolol to 100 mg p.o. nightly.  Patient states that he has noticed some on and off pain in his left shoulder blade over the past several months that he feels like has been relieved with a heating pad.  States that last night he began having severe, stabbing, constant left shoulder blade and left posterior neck pain that was not made worse with movement.  He was unable to relieve the pain with medication or heating pad.  He had associated RVR with a heart rate of about 110 last night.  This morning his pain is gone however he noticed that his heart rate was very rapid.  He denies feelings of presyncope or chest pain.  He denies shortness of breath.  Patient was concerned predominantly with his left shoulder plain being a potential symptom of his heart.  He denies any recent fevers, chills, urinary symptoms, symptoms of URI.  Patient is on Eliquis.  He denies melena or hematochezia.  HPI  Past Medical History:  Diagnosis Date  . Allergy   . Atrial tachycardia ([place])   . Blood transfusion 1944  . Cataract   . Diverticulosis   . Heart murmur   . Hemorrhoids   . Hyperlipemia   . Prostate cancer [facility])    Gleason 6 status post radiation seed implant September 2010  . Rheumatic heart disease    78yrs old    Patient  Active Problem List   Diagnosis Date Noted  . Other persistent atrial fibrillation   . Acute recurrent maxillary sinusitis 06/28/2014  . Aspirin allergy 06/28/2014  . Hyperlipidemia LDL goal < 130 11/09/2012  . Patient noncompliant with statin medication 11/09/2012  . Other abnormal glucose 11/09/2012  . Seasonal and perennial allergic rhinitis 12/04/2011  . Atrial tachycardiaI.- status post RFCA 02/20/2011  . Routine general medical examination at a health care facility 10/29/2010  . Actinic keratosis 02/05/2010  . ADENOCARCINOMA, PROSTATE, GLEASON GRADE 6 09/02/2008  . RHEUMATIC FEVER WITH HEART INVOLVEMENT 08/15/2008  . HYPERTENSION, HEART CONTROLLED W/O ASSOC CHF 08/15/2008    Past Surgical History:  Procedure Laterality Date  . CARDIAC ELECTROPHYSIOLOGY STUDY AND ABLATION  2011   for SVT  . CARDIOVERSION N/A 05/18/2018   Procedure: CARDIOVERSION;  Surgeon: Buford Dresser, MD;  Location: [place];  Service: Cardiovascular;  Laterality: N/A;  . CATARACT EXTRACTION W/ INTRAOCULAR LENS IMPLANT  2009   right  . INGUINAL HERNIA REPAIR  01/2006   right  . MELANOMA EXCISION  2011   superficial resection left maxillary area/ Dr [name]  . TEE WITHOUT CARDIOVERSION N/A 05/18/2018   Procedure: TRANSESOPHAGEAL ECHOCARDIOGRAM (TEE);  Surgeon: Buford Dresser, MD;  Location: [facility] ENDOSCOPY;  Service: Cardiovascular;  Laterality: N/A;  . [place]  . TOTAL KNEE ARTHROPLASTY  1/11   s/p left  . WRIST FUSION  2006   04/2004        Home Medications    Prior to Admission medications   Medication Sig Start Date End Date Taking? Authorizing Provider  acetaminophen (TYLENOL) 500 MG tablet Take 500 mg by mouth every 6 (six) hours as needed for moderate pain.     [provider]  amiodarone (PACERONE) 200 MG tablet Take 200 mg twice a day for 2 weeks then take 200 mg daily 05/21/18   Lelon Perla, MD  apixaban (ELIQUIS) 5 MG TABS  tablet Take 1 tablet (5 mg total) by mouth 2 (two) times daily. 05/11/18   Lelon Perla, MD  metoprolol succinate (TOPROL-XL) 100 MG 24 hr tablet Take 1 tablet (100 mg total) by mouth daily. Take with or immediately following a meal. 05/21/18 08/19/18  Lelon Perla, MD  Propylene Glycol (SYSTANE BALANCE) 0.6 % SOLN Apply 1 drop to eye at bedtime.    [provider]    Family History Family History  Problem Relation Age of Onset  . Arthritis Other   . Cancer Other        Breast cancer 1st degree relative<50  . Breast cancer Mother   . Breast cancer Maternal Grandmother   . Colon cancer Neg Hx   . Esophageal cancer Neg Hx   . Pancreatic cancer Neg Hx   . Rectal cancer Neg Hx   . Stomach cancer Neg Hx     Social History Social History   Tobacco Use  . Smoking status: Never Smoker  . Smokeless tobacco: Never Used  Substance Use Topics  . Alcohol use: No  . Drug use: No     Allergies   Asa arthritis strength-antacid [aspirin buffered] and Penicillins   Review of Systems Review of Systems  Ten systems reviewed and are negative for acute change, except as noted in the HPI.   Physical Exam Updated Vital Signs BP (!) 155/116 (BP Location: Right Arm)   Pulse (!) 135   Resp 18   SpO2 98%   Physical Exam Physical Exam  Nursing note and vitals reviewed. Constitutional: He appears well-developed and well-nourished. No distress.  HENT:  Head: Normocephalic and atraumatic.  Eyes: Conjunctivae normal are normal. No scleral icterus.  Neck: Normal range of motion. Neck supple.  Cardiovascular: Tachycardia, irregularly irregular rhythm and normal heart sounds.   Pulmonary/Chest: Effort normal and breath sounds normal. No respiratory distress.  Abdominal: Soft. There is no tenderness.  Musculoskeletal: He exhibits no edema. No tenderness reproducible left shoulder pain Neurological: He is alert.  Skin: Skin is warm and dry. He is not diaphoretic.  Psychiatric:  His behavior is normal.   CHA2DS2/VAS Stroke Risk Points  Current as of 2 hours ago     3 >= 2 Points: High Risk  1 - 1.99 Points: Medium Risk  0 Points: Low Risk    The patient's score has not changed in the past year.:  No Change     Details    This score determines the patient's risk of having a stroke if the  patient has atrial fibrillation.       Points Metrics  0 Has Congestive Heart Failure:  No    Current as of 2 hours ago  0 Has Vascular Disease:  No    Current as of 2 hours ago  1 Has Hypertension:  Yes    Current as of 2 hours ago  2  Age:  47    Current as of 2 hours ago  0 Has Diabetes:  No    Current as of 2 hours ago  0 Had Stroke:  No  Had TIA:  No  Had thromboembolism:  No    Current as of 2 hours ago  0 Male:  No    Current as of 2 hours ago            ED Treatments / Results  Labs (all labs ordered are listed, but only abnormal results are displayed) Labs Reviewed  BASIC METABOLIC PANEL - Abnormal; Notable for the following components:      Result Value   Glucose, Bld 121 (*)    Creatinine, Ser 1.39 (*)    GFR calc non Af Amer 49 (*)    GFR calc Af Amer 57 (*)    All other components within normal limits  TSH - Abnormal; Notable for the following components:   TSH 4.713 (*)    All other components within normal limits  BRAIN NATRIURETIC PEPTIDE - Abnormal; Notable for the following components:   B Natriuretic Peptide 1,048.3 (*)    All other components within normal limits  MAGNESIUM  CBC  T4, FREE  TROPONIN I  TROPONIN I  I-STAT TROPONIN, ED    EKG EKG Interpretation  Date/Time:  Monday May 25 2018 08:22:14 EST Ventricular Rate:  132 PR Interval:    QRS Duration: 92 QT Interval:  326 QTC Calculation: 483 R Axis:   -66 Text Interpretation:  Atrial fibrillation with rapid ventricular response Pulmonary disease pattern Left anterior fascicular block Nonspecific ST and T wave abnormality Abnormal ECG Since prior ECG, patient is  now in atrial fibrillation with RVR Confirmed by Gareth Morgan [phone]) on 05/25/2018 8:59:26 AM   Radiology No results found.  Procedures Procedures (including critical care time)  Medications Ordered in ED Medications  amiodarone (PACERONE) tablet 200 mg (has no administration in time range)     Initial Impression / Assessment and Plan / ED Course  I have reviewed the triage vital signs and the nursing notes.  Pertinent labs & imaging results that were available during my care of the patient were reviewed by me and considered in my medical decision making (see chart for details).     78 year old male with left-sided shoulder pain, A. fib with RVR.  Patient given his normal dose of p.o. amiodarone along with 20 mg of diltiazem with resolution of his rapid ventricular rate.  Patient has had 2- troponins here in the emergency department.  I have low suspicion for ACS as the cause of the pain in his left shoulder.  He has had no active pain in his shoulder since last night.  He denies any symptoms of rapid ventricular rate including shortness of breath, dizziness.  He denies chest pain.  He does not appear to be volume overloaded.  Although the patient's BNP is elevated I do not have a baseline and he does indicate that his last EF was about 20 to 25%.  Patient has close follow-up the day after tomorrow at the A. fib clinic.  I discussed reasons to seek immediate medical care at the emergency department.  Patient seen and shared visit with Dr. Billy Fischer who agrees with plan for discharge and close outpatient follow-up.  Patient appears appropriate for discharge at this time.  Final Clinical Impressions(s) / ED Diagnoses   Final diagnoses:  None    ED Discharge Orders  Ordered    Amb referral to AFIB Clinic     05/25/18 0842           Margarita Mail, PA-C 05/25/18 1606    Gareth Morgan, MD 05/26/18 2109    Margarita Mail, PA-C 05/28/18 1502    Gareth Morgan, MD 05/29/18 [phone]

## 2018-05-25 NOTE — Discharge Instructions (Signed)
Get help right away if you have:  Chest pain, abdominal pain, sweating, or weakness. Difficulty breathing. Blood in your vomit, stool (feces), or urine. Any symptoms of a stroke. "BE FAST" is an easy way to remember the main warning signs of a stroke: B - Balance. Signs are dizziness, sudden trouble walking, or loss of balance. E - Eyes. Signs are trouble seeing or a sudden change in vision. F - Face. Signs are sudden weakness or numbness of the face, or the face or eyelid drooping on one side. A - Arms. Signs are weakness or numbness in an arm. This happens suddenly and usually on one side of the body. S - Speech. Signs are sudden trouble speaking, slurred speech, or trouble understanding what people say. T - Time. Time to call emergency services. Write down what time symptoms started. Other signs of a stroke, such as: A sudden, severe headache with no known cause. Nausea or vomiting. Seizure.

## 2018-05-25 NOTE — ED Notes (Signed)
Pt temp. 98.3

## 2018-05-27 ENCOUNTER — Encounter: Payer: Self-pay | Admitting: Internal Medicine

## 2018-05-27 ENCOUNTER — Ambulatory Visit (INDEPENDENT_AMBULATORY_CARE_PROVIDER_SITE_OTHER): Payer: Medicare Other | Admitting: Internal Medicine

## 2018-05-27 VITALS — BP 132/88 | HR 72 | Ht 74.0 in | Wt 203.2 lb

## 2018-05-27 DIAGNOSIS — I471 Supraventricular tachycardia: Secondary | ICD-10-CM | POA: Diagnosis not present

## 2018-05-27 DIAGNOSIS — M542 Cervicalgia: Secondary | ICD-10-CM | POA: Diagnosis not present

## 2018-05-27 DIAGNOSIS — I4819 Other persistent atrial fibrillation: Secondary | ICD-10-CM

## 2018-05-27 DIAGNOSIS — I10 Essential (primary) hypertension: Secondary | ICD-10-CM

## 2018-05-27 DIAGNOSIS — M5030 Other cervical disc degeneration, unspecified cervical region: Secondary | ICD-10-CM | POA: Diagnosis not present

## 2018-05-27 NOTE — Patient Instructions (Addendum)
Medication Instructions:  Your physician recommends that you continue on your current medications as directed. Please refer to the Current Medication list given to you today.  Labwork: None ordered.  Testing/Procedures: None ordered.  Follow-Up: Your physician wants you to follow-up in: 3 months with Dr. Allred.      Any Other Special Instructions Will Be Listed Below (If Applicable).  If you need a refill on your cardiac medications before your next appointment, please call your pharmacy.   

## 2018-05-27 NOTE — Progress Notes (Signed)
PCP: Velna Hatchet, MD Primary Cardiologist: Dr Stanford Breed Primary EP: Dr [name]  Patrick [name] is a 78 y.o. male who presents today for routine electrophysiology followup.  Since last being seen in our clinic, the patient reports doing reasonably well.  Though he has had significant atrial arrhythmias since I saw him last, this has become quiescent since starting amiodarone.  His concern is with shoulder pain.  He was recently seen in the ED.  I do not see that he has had recent coronary assessment but will defer to Dr Stanford Breed.  Today, he denies symptoms of palpitations, chest pain, shortness of breath,  lower extremity edema, dizziness, presyncope, or syncope.  The patient is otherwise without complaint today.   Past Medical History:  Diagnosis Date  . Allergy   . Atrial tachycardia ([place])   . Blood transfusion 1944  . Cataract   . Diverticulosis   . Heart murmur   . Hemorrhoids   . Hyperlipemia   . Prostate cancer [facility])    Gleason 6 status post radiation seed implant September 2010  . Rheumatic heart disease    78yrs old   Past Surgical History:  Procedure Laterality Date  . CARDIAC ELECTROPHYSIOLOGY STUDY AND ABLATION  2011   for SVT  . CARDIOVERSION N/A 05/18/2018   Procedure: CARDIOVERSION;  Surgeon: Buford Dresser, MD;  Location: [place];  Service: Cardiovascular;  Laterality: N/A;  . CATARACT EXTRACTION W/ INTRAOCULAR LENS IMPLANT  2009   right  . INGUINAL HERNIA REPAIR  01/2006   right  . MELANOMA EXCISION  2011   superficial resection left maxillary area/ Dr [name]  . TEE WITHOUT CARDIOVERSION N/A 05/18/2018   Procedure: TRANSESOPHAGEAL ECHOCARDIOGRAM (TEE);  Surgeon: Buford Dresser, MD;  Location: [facility] ENDOSCOPY;  Service: Cardiovascular;  Laterality: N/A;  . [place]  . TOTAL KNEE ARTHROPLASTY  1/11   s/p left  . WRIST FUSION  2006   04/2004    ROS- all systems are reviewed and negatives except as per HPI  above  Current Outpatient Medications  Medication Sig Dispense Refill  . acetaminophen (TYLENOL) 500 MG tablet Take 500 mg by mouth every 6 (six) hours as needed (shoulder pain).     Marland Kitchen amiodarone (PACERONE) 200 MG tablet Take 200 mg twice a day for 2 weeks then take 200 mg daily (Patient taking differently: Take 200 mg by mouth 2 (two) times daily. ) 60 tablet 6  . apixaban (ELIQUIS) 5 MG TABS tablet Take 1 tablet (5 mg total) by mouth 2 (two) times daily. 60 tablet 6  . metoprolol succinate (TOPROL-XL) 100 MG 24 hr tablet Take 1 tablet (100 mg total) by mouth daily. Take with or immediately following a meal. 90 tablet 3  . Propylene Glycol (SYSTANE BALANCE) 0.6 % SOLN Apply 1 drop to eye at bedtime.     No current facility-administered medications for this visit.     Physical Exam: Vitals:   05/27/18 0908  BP: 132/88  Pulse: 72  SpO2: 99%  Weight: 203 lb 3.2 oz (92.2 kg)  Height: 6\' 2"  (1.88 m)    GEN- The patient is well appearing, alert and oriented x 3 today.   Head- normocephalic, atraumatic Eyes-  Sclera clear, conjunctiva pink Ears- hearing intact Oropharynx- clear Lungs- Clear to ausculation bilaterally, normal work of breathing Heart- Regular rate and rhythm, no murmurs, rubs or gallops, PMI not laterally displaced GI- soft, NT, ND, + BS Extremities- no clubbing, cyanosis, or edema  Wt  Readings from Last 3 Encounters:  05/27/18 203 lb 3.2 oz (92.2 kg)  05/21/18 205 lb (93 kg)  05/18/18 205 lb (93 kg)    EKG tracing ordered today is personally reviewed and shows sinus rhythm 72 bpm, PR 190 msec, QRS 110 msec, QTc 477 msec, LAHB  Assessment and Plan:  1. Paroxysmal atrial tachycardia, atypical atrial flutter, and afib He has multiple atrial arrhythmias since I saw him last He declined tikosyn but is doing well with amiodarone. Though we could consider ablation, he would prefer to continue amiodarone at this time.  Given prior rheumatic heart disease, I am in favor  of this approach also. Continue eliquis  2. HTN Stable No change required today  Follow-up with Dr Stanford Breed as scheduled I will see in 3 months  [name] MD, [facility] 05/27/2018 9:34 AM

## 2018-05-29 NOTE — Progress Notes (Signed)
HPI: FU CM and atrial fibrillation. Patient has had previous ablation of atrial tachycardia in 2011. Patient seen December 2019 with complaints of palpitations. He was found to be in atrial tachycardia. Patient was placed on beta-blocker. He was felt to be at increased risk for repeat ablation given history of rheumatic fever. Echo was ordered and showed newly reduced LV function with ejection fraction 20 to 25%, mild left ventricular hypertrophy, mild mitral regurgitation. His heart rate during the study was 120-160.  Had TEE on May 18, 2018.  Ejection fraction 20 to 25%, mild mitral regurgitation, no left atrial appendage thrombus.  Patient successfully cardioverted.  Note Dr. [name] had planned to admit afterwards and begin Tikosyn but patient declined as his wife had a cardiac arrest on Tikosyn. Found to be in atrial fibrillation at follow-up office visit.  Amiodarone initiated.  Seen with left upper extremity pain January 27.  CTA showed no dissection or pulmonary thromboembolism.  Troponins were normal.  TSH 4.713.  BNP 1048.  At follow-up office visit with Dr. [name] January 29 patient was in sinus rhythm.  Since last seen patient feels much better.  He denies dyspnea, chest pain, palpitations or syncope.  No bleeding.  Current Outpatient Medications  Medication Sig Dispense Refill  . acetaminophen (TYLENOL) 500 MG tablet Take 500 mg by mouth every 6 (six) hours as needed (shoulder pain).     Marland Kitchen amiodarone (PACERONE) 200 MG tablet Take 200 mg twice a day for 2 weeks then take 200 mg daily (Patient taking differently: Take 200 mg by mouth 2 (two) times daily. ) 60 tablet 6  . apixaban (ELIQUIS) 5 MG TABS tablet Take 1 tablet (5 mg total) by mouth 2 (two) times daily. 60 tablet 6  . metoprolol succinate (TOPROL-XL) 100 MG 24 hr tablet Take 1 tablet (100 mg total) by mouth daily. Take with or immediately following a meal. 90 tablet 3  . Propylene Glycol (SYSTANE BALANCE) 0.6 % SOLN  Apply 1 drop to eye at bedtime.     No current facility-administered medications for this visit.      Past Medical History:  Diagnosis Date  . Allergy   . Atrial tachycardia ([place])   . Blood transfusion 1944  . Cataract   . Diverticulosis   . Heart murmur   . Hemorrhoids   . Hyperlipemia   . Prostate cancer [facility])    Gleason 6 status post radiation seed implant September 2010  . Rheumatic heart disease    78yrs old    Past Surgical History:  Procedure Laterality Date  . CARDIAC ELECTROPHYSIOLOGY STUDY AND ABLATION  2011   for SVT  . CARDIOVERSION N/A 05/18/2018   Procedure: CARDIOVERSION;  Surgeon: Buford Dresser, MD;  Location: [place];  Service: Cardiovascular;  Laterality: N/A;  . CATARACT EXTRACTION W/ INTRAOCULAR LENS IMPLANT  2009   right  . INGUINAL HERNIA REPAIR  01/2006   right  . MELANOMA EXCISION  2011   superficial resection left maxillary area/ Dr [name]  . TEE WITHOUT CARDIOVERSION N/A 05/18/2018   Procedure: TRANSESOPHAGEAL ECHOCARDIOGRAM (TEE);  Surgeon: Buford Dresser, MD;  Location: [facility] ENDOSCOPY;  Service: Cardiovascular;  Laterality: N/A;  . [place]  . TOTAL KNEE ARTHROPLASTY  1/11   s/p left  . WRIST FUSION  2006   04/2004    Social History   Socioeconomic History  . Marital status: Married    Spouse name: Not on file  . Number of  children: Not on file  . Years of education: Not on file  . Highest education level: Not on file  Occupational History  . Occupation: Retired    Fish farm manager: [employer]  Social Needs  . Financial resource strain: Not on file  . Food insecurity:    Worry: Not on file    Inability: Not on file  . Transportation needs:    Medical: Not on file    Non-medical: Not on file  Tobacco Use  . Smoking status: Never Smoker  . Smokeless tobacco: Never Used  Substance and Sexual Activity  . Alcohol use: No  . Drug use: No  . Sexual activity: Not Currently  Lifestyle    . Physical activity:    Days per week: Not on file    Minutes per session: Not on file  . Stress: Not on file  Relationships  . Social connections:    Talks on phone: Not on file    Gets together: Not on file    Attends religious service: Not on file    Active member of club or organization: Not on file    Attends meetings of clubs or organizations: Not on file    Relationship status: Not on file  . Intimate partner violence:    Fear of current or ex partner: Not on file    Emotionally abused: Not on file    Physically abused: Not on file    Forced sexual activity: Not on file  Other Topics Concern  . Not on file  Social History Narrative   Regular exercise-Yes   Married   Lives in [place]    Family History  Problem Relation Age of Onset  . Arthritis Other   . Cancer Other        Breast cancer 1st degree relative<50  . Breast cancer Mother   . Breast cancer Maternal Grandmother   . Colon cancer Neg Hx   . Esophageal cancer Neg Hx   . Pancreatic cancer Neg Hx   . Rectal cancer Neg Hx   . Stomach cancer Neg Hx     ROS: no fevers or chills, productive cough, hemoptysis, dysphasia, odynophagia, melena, hematochezia, dysuria, hematuria, rash, seizure activity, orthopnea, PND, pedal edema, claudication. Remaining systems are negative.  Physical Exam: Well-developed well-nourished in no acute distress.  Skin is warm and dry.  HEENT is normal.  Neck is supple.  Chest is clear to auscultation with normal expansion.  Cardiovascular exam is regular rate and rhythm.  Abdominal exam nontender or distended. No masses palpated. Extremities show no edema. neuro grossly intact  ECG-normal sinus rhythm at a rate of 62, left anterior fascicular block, nonspecific ST changes, right atrial enlargement.  Personally reviewed  A/P  1 paroxysmal atrial fibrillation-patient is back in sinus rhythm after the addition of amiodarone.  We will continue 200 mg daily.  Continue  Toprol.  Continue apixaban.  We will see him back in approximately 4 months.  Check TSH, liver functions and chest x-ray at that time.  2 cardiomyopathy-recent severe LV dysfunction.  Felt likely related to tachycardia.  He is now back in sinus rhythm.  Continue Toprol.  Add losartan 50 mg daily.  Check potassium and renal function in 1 week.  Titrate medications.  Repeat echocardiogram in 3 months.  If ejection fraction remains decreased would need ischemia evaluation.  3 hypertension-patient's blood pressure is controlled.  Add ARB as outlined above for cardiomyopathy.  4 history of atrial tachycardia-status post prior ablation.  Kirk Ruths, MD

## 2018-06-08 ENCOUNTER — Encounter: Payer: Self-pay | Admitting: Cardiology

## 2018-06-08 ENCOUNTER — Ambulatory Visit (INDEPENDENT_AMBULATORY_CARE_PROVIDER_SITE_OTHER): Payer: Medicare Other | Admitting: Cardiology

## 2018-06-08 VITALS — BP 130/68 | HR 62 | Ht 74.0 in | Wt 199.2 lb

## 2018-06-08 DIAGNOSIS — I480 Paroxysmal atrial fibrillation: Secondary | ICD-10-CM

## 2018-06-08 DIAGNOSIS — I428 Other cardiomyopathies: Secondary | ICD-10-CM

## 2018-06-08 DIAGNOSIS — I10 Essential (primary) hypertension: Secondary | ICD-10-CM | POA: Diagnosis not present

## 2018-06-08 MED ORDER — LOSARTAN POTASSIUM 50 MG PO TABS: 50.0000 mg | ORAL_TABLET | Freq: Every day | ORAL | 3 refills | Status: DC

## 2018-06-08 NOTE — Patient Instructions (Signed)
Medication Instructions:  START LOSARTAN 50 MG ONCE DAILY If you need a refill on your cardiac medications before your next appointment, please call your pharmacy.   Lab work: Your physician recommends that you return for lab work in:ONE WEEK If you have labs (blood work) drawn today and your tests are completely normal, you will receive your results only by: Marland Kitchen MyChart Message (if you have MyChart) OR . A paper copy in the mail If you have any lab test that is abnormal or we need to change your treatment, we will call you to review the results.  Testing/Procedures: Your physician has requested that you have an echocardiogram. Echocardiography is a painless test that uses sound waves to create images of your heart. It provides your doctor with information about the size and shape of your heart and how well your heart's chambers and valves are working. This procedure takes approximately one hour. There are no restrictions for this procedure.  SCHEDULE IN 4 MONTHS  Follow-Up: At [facility], you and your health needs are our priority.  As part of our continuing mission to provide you with exceptional heart care, we have created designated Provider Care Teams.  These Care Teams include your primary Cardiologist (physician) and Advanced Practice Providers (APPs -  Physician Assistants and Nurse Practitioners) who all work together to provide you with the care you need, when you need it. Your physician recommends that you schedule a follow-up appointment in: 4 MONTHS WITH DR Stanford Breed AFTER ECHO COMPLETE

## 2018-06-09 DIAGNOSIS — I428 Other cardiomyopathies: Secondary | ICD-10-CM

## 2018-06-09 MED ORDER — LOSARTAN POTASSIUM 25 MG PO TABS: 50.0000 mg | ORAL_TABLET | Freq: Every day | ORAL | 3 refills | Status: DC

## 2018-06-12 ENCOUNTER — Ambulatory Visit (HOSPITAL_COMMUNITY)
Admission: EM | Admit: 2018-06-12 | Discharge: 2018-06-12 | Disposition: A | Discharge status: Home / Self Care | Payer: Medicare Other | Attending: Family Medicine | Admitting: Family Medicine

## 2018-06-12 ENCOUNTER — Encounter (HOSPITAL_COMMUNITY): Payer: Self-pay | Admitting: Emergency Medicine

## 2018-06-12 DIAGNOSIS — R2241 Localized swelling, mass and lump, right lower limb: Secondary | ICD-10-CM

## 2018-06-12 DIAGNOSIS — S8011XA Contusion of right lower leg, initial encounter: Secondary | ICD-10-CM | POA: Diagnosis not present

## 2018-06-12 DIAGNOSIS — W228XXA Striking against or struck by other objects, initial encounter: Secondary | ICD-10-CM

## 2018-06-12 NOTE — Discharge Instructions (Addendum)
Continue the ice packs Expect another 3-4 weeks for complete resolution of the hematoma

## 2018-06-12 NOTE — ED Triage Notes (Signed)
Pt here for right leg bruising and swelling after hitting 1 week ago; swelling and redness noted to area; pt takes eliquis

## 2018-06-12 NOTE — ED Provider Notes (Signed)
[place]    CSN: [id number] Arrival date & time: 06/12/18  1324     History   Chief Complaint Chief Complaint  Patient presents with  . Leg Pain    HPI Patrick [name] is a 78 y.o. male.   Pt here for right leg bruising and swelling after hitting 1 week and 1 day ago on pull down ladder. Reports wood went into leg without known splintering; swelling and redness noted to area; pt takes eliquis.       Past Medical History:  Diagnosis Date  . Allergy   . Atrial tachycardia ([place])   . Blood transfusion 1944  . Cataract   . Diverticulosis   . Heart murmur   . Hemorrhoids   . Hyperlipemia   . Prostate cancer [facility])    Gleason 6 status post radiation seed implant September 2010  . Rheumatic heart disease    78yrs old    Patient Active Problem List   Diagnosis Date Noted  . Other persistent atrial fibrillation   . Acute recurrent maxillary sinusitis 06/28/2014  . Aspirin allergy 06/28/2014  . Hyperlipidemia LDL goal < 130 11/09/2012  . Patient noncompliant with statin medication 11/09/2012  . Other abnormal glucose 11/09/2012  . Seasonal and perennial allergic rhinitis 12/04/2011  . Atrial tachycardiaI.- status post RFCA 02/20/2011  . Routine general medical examination at a health care facility 10/29/2010  . Actinic keratosis 02/05/2010  . ADENOCARCINOMA, PROSTATE, GLEASON GRADE 6 09/02/2008  . RHEUMATIC FEVER WITH HEART INVOLVEMENT 08/15/2008  . HYPERTENSION, HEART CONTROLLED W/O ASSOC CHF 08/15/2008    Past Surgical History:  Procedure Laterality Date  . CARDIAC ELECTROPHYSIOLOGY STUDY AND ABLATION  2011   for SVT  . CARDIOVERSION N/A 05/18/2018   Procedure: CARDIOVERSION;  Surgeon: Buford Dresser, MD;  Location: [place];  Service: Cardiovascular;  Laterality: N/A;  . CATARACT EXTRACTION W/ INTRAOCULAR LENS IMPLANT  2009   right  . INGUINAL HERNIA REPAIR  01/2006   right  . MELANOMA EXCISION  2011   superficial resection left  maxillary area/ Dr [name]  . TEE WITHOUT CARDIOVERSION N/A 05/18/2018   Procedure: TRANSESOPHAGEAL ECHOCARDIOGRAM (TEE);  Surgeon: Buford Dresser, MD;  Location: [facility] ENDOSCOPY;  Service: Cardiovascular;  Laterality: N/A;  . [place]  . TOTAL KNEE ARTHROPLASTY  1/11   s/p left  . WRIST FUSION  2006   04/2004       Home Medications    Prior to Admission medications   Medication Sig Start Date End Date Taking? Authorizing Provider  acetaminophen (TYLENOL) 500 MG tablet Take 500 mg by mouth every 6 (six) hours as needed (shoulder pain).     [provider]  amiodarone (PACERONE) 200 MG tablet Take 200 mg twice a day for 2 weeks then take 200 mg daily Patient taking differently: Take 200 mg by mouth 2 (two) times daily.  05/21/18   Lelon Perla, MD  apixaban (ELIQUIS) 5 MG TABS tablet Take 1 tablet (5 mg total) by mouth 2 (two) times daily. 05/11/18   Lelon Perla, MD  losartan (COZAAR) 25 MG tablet Take 2 tablets (50 mg total) by mouth daily. 06/09/18 09/07/18  Lelon Perla, MD  metoprolol succinate (TOPROL-XL) 100 MG 24 hr tablet Take 1 tablet (100 mg total) by mouth daily. Take with or immediately following a meal. 05/21/18 08/19/18  Lelon Perla, MD  Propylene Glycol (SYSTANE BALANCE) 0.6 % SOLN Apply 1 drop to eye at bedtime.  [provider]    Family History Family History  Problem Relation Age of Onset  . Arthritis Other   . Cancer Other        Breast cancer 1st degree relative<50  . Breast cancer Mother   . Breast cancer Maternal Grandmother   . Colon cancer Neg Hx   . Esophageal cancer Neg Hx   . Pancreatic cancer Neg Hx   . Rectal cancer Neg Hx   . Stomach cancer Neg Hx     Social History Social History   Tobacco Use  . Smoking status: Never Smoker  . Smokeless tobacco: Never Used  Substance Use Topics  . Alcohol use: No  . Drug use: No     Allergies   Asa arthritis strength-antacid  [aspirin buffered] and Penicillins   Review of Systems Review of Systems  Respiratory: Negative for chest tightness.   Cardiovascular: Positive for leg swelling. Negative for chest pain.       Right leg   Gastrointestinal: Negative for blood in stool.  Hematological: Does not bruise/bleed easily.       Denies nosebleeds or bleeding gums     Physical Exam Triage Vital Signs ED Triage Vitals  Enc Vitals Group     BP 06/12/18 1356 105/63     Pulse Rate 06/12/18 1356 79     Resp 06/12/18 1356 18     Temp 06/12/18 1356 97.8 F (36.6 C)     Temp Source 06/12/18 1356 Oral     SpO2 06/12/18 1356 97 %     Weight --      Height --      Head Circumference --      Peak Flow --      Pain Score 06/12/18 1358 3     Pain Loc --      Pain Edu? --      Excl. in [place]? --    No data found.  Updated Vital Signs BP 105/63 (BP Location: Right Arm)   Pulse 79   Temp 97.8 F (36.6 C) (Oral)   Resp 18   SpO2 97%    Physical Exam Vitals signs and nursing note reviewed.  Constitutional:      Appearance: Normal appearance.  Cardiovascular:     Rate and Rhythm: Normal rate and regular rhythm.     Pulses: Normal pulses.     Heart sounds: Normal heart sounds.  Musculoskeletal: Normal range of motion.        General: Swelling and signs of injury present. No tenderness.  Skin:    General: Skin is warm and dry.     Capillary Refill: Capillary refill takes less than 2 seconds.     Comments: Almost the entire right lower extremity is ecchymotic with proximal anterior 2 to 3 cm raised subcutaneous fluctuant mass and overlying half centimeter eschar  Neurological:     General: No focal deficit present.     Mental Status: He is alert.  Psychiatric:        Mood and Affect: Mood normal.        Behavior: Behavior normal.        Thought Content: Thought content normal.        Judgment: Judgment normal.      UC Treatments / Results  Labs (all labs ordered are listed, but only abnormal  results are displayed) Labs Reviewed - No data to display  EKG None  Radiology No results found.  Procedures Procedures (including critical care  time)  Medications Ordered in UC Medications - No data to display  Initial Impression / Assessment and Plan / UC Course  I have reviewed the triage vital signs and the nursing notes.  Pertinent labs & imaging results that were available during my care of the patient were reviewed by me and considered in my medical decision making (see chart for details).    Final Clinical Impressions(s) / UC Diagnoses   Final diagnoses:  Leg hematoma, right, initial encounter     Discharge Instructions     Continue the ice packs Expect another 3-4 weeks for complete resolution of the hematoma    ED Prescriptions    None     Controlled Substance Prescriptions [place] Controlled Substance Registry consulted? Not Applicable   Robyn Haber, MD 06/12/18 1429

## 2018-06-22 DIAGNOSIS — I428 Other cardiomyopathies: Secondary | ICD-10-CM | POA: Diagnosis not present

## 2018-06-22 LAB — BASIC METABOLIC PANEL
BUN/Creatinine Ratio: 13 (ref 10–24)
BUN: 14 mg/dL (ref 8–27)
CO2: 24 mmol/L (ref 20–29)
Calcium: 9.7 mg/dL (ref 8.6–10.2)
Chloride: 101 mmol/L (ref 96–106)
Creatinine, Ser: 1.12 mg/dL (ref 0.76–1.27)
GFR calc Af Amer: 73 mL/min/{1.73_m2} (ref >59)
GFR calc non Af Amer: 63 mL/min/{1.73_m2} (ref >59)
Glucose: 88 mg/dL (ref 65–99)
Potassium: 5.2 mmol/L (ref 3.5–5.2)
Sodium: 139 mmol/L (ref 134–144)

## 2018-08-24 ENCOUNTER — Telehealth: Payer: Self-pay

## 2018-08-24 ENCOUNTER — Ambulatory Visit: Payer: Medicare Other | Admitting: Cardiology

## 2018-08-24 NOTE — Telephone Encounter (Signed)
Spoke with pt regarding appt on 08/26/18. Pt is unable to access a smart device. Pt was advise to keep phone visit and check vitals prior to appt. Pt questions and concerns were address.

## 2018-08-26 ENCOUNTER — Encounter: Payer: Self-pay | Admitting: Internal Medicine

## 2018-08-26 ENCOUNTER — Telehealth (INDEPENDENT_AMBULATORY_CARE_PROVIDER_SITE_OTHER): Payer: Medicare Other | Admitting: Internal Medicine

## 2018-08-26 ENCOUNTER — Other Ambulatory Visit: Payer: Self-pay

## 2018-08-26 VITALS — BP 137/56 | HR 55

## 2018-08-26 DIAGNOSIS — I480 Paroxysmal atrial fibrillation: Secondary | ICD-10-CM

## 2018-08-26 DIAGNOSIS — I471 Supraventricular tachycardia: Secondary | ICD-10-CM | POA: Diagnosis not present

## 2018-08-26 DIAGNOSIS — I4719 Other supraventricular tachycardia: Secondary | ICD-10-CM

## 2018-08-26 DIAGNOSIS — I519 Heart disease, unspecified: Secondary | ICD-10-CM | POA: Diagnosis not present

## 2018-08-26 NOTE — Progress Notes (Signed)
Electrophysiology TeleHealth Note   Due to national recommendations of social distancing due to [place] 19, an audio telehealth visit is felt to be most appropriate for this patient at this time.  Verbal consent was obtained from the patient today.  He does not have a smart phone.  He does not have a webcam.  He therefore is not a candidate for virtual visit.   Date:  08/26/2018   ID:  Patrick [name], DOB 1940/08/20, MRN [id number]  Location: patient's home  Provider location: [address] [place]  Evaluation Performed: Follow-up visit  PCP:  Velna Hatchet, MD  Cardiologist:  Dr Stanford Breed Electrophysiologist:  Dr [name]  Chief Complaint:  afib  History of Present Illness:    Patrick [name] is a 78 y.o. male who presents via audioconferencing for a telehealth visit today.  Since last being seen in our clinic, the patient reports doing very well.  atach/afib is well controlled.  He has elevated heart rates on rare occasions (once or twice per month, with HR around 100 bpm).  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 Today, he denies symptoms of palpitations, chest pain, shortness of breath,  lower extremity edema, dizziness, presyncope, or syncope.  The patient is otherwise without complaint today.  The patient denies symptoms of fevers, chills, cough, or new SOB worrisome for COVID 19.  Past Medical History:  Diagnosis Date  . Allergy   . Atrial tachycardia ([place])   . Blood transfusion 1944  . Cataract   . Diverticulosis   . Heart murmur   . Hemorrhoids   . Hyperlipemia   . Prostate cancer [facility])    Gleason 6 status post radiation seed implant September 2010  . Rheumatic heart disease    78yrs old    Past Surgical History:  Procedure Laterality Date  . CARDIAC ELECTROPHYSIOLOGY STUDY AND ABLATION  2011   for SVT  . CARDIOVERSION N/A 05/18/2018   Procedure: CARDIOVERSION;  Surgeon: Buford Dresser, MD;  Location: [place];  Service:  Cardiovascular;  Laterality: N/A;  . CATARACT EXTRACTION W/ INTRAOCULAR LENS IMPLANT  2009   right  . INGUINAL HERNIA REPAIR  01/2006   right  . MELANOMA EXCISION  2011   superficial resection left maxillary area/ Dr [name]  . TEE WITHOUT CARDIOVERSION N/A 05/18/2018   Procedure: TRANSESOPHAGEAL ECHOCARDIOGRAM (TEE);  Surgeon: Buford Dresser, MD;  Location: [facility] ENDOSCOPY;  Service: Cardiovascular;  Laterality: N/A;  . [place]  . TOTAL KNEE ARTHROPLASTY  1/11   s/p left  . WRIST FUSION  2006   04/2004    Current Outpatient Medications  Medication Sig Dispense Refill  . acetaminophen (TYLENOL) 500 MG tablet Take 500 mg by mouth every 6 (six) hours as needed (shoulder pain).     Marland Kitchen amiodarone (PACERONE) 200 MG tablet Take 200 mg by mouth daily.    Marland Kitchen apixaban (ELIQUIS) 5 MG TABS tablet Take 1 tablet (5 mg total) by mouth 2 (two) times daily. 60 tablet 6  . losartan (COZAAR) 25 MG tablet Take 2 tablets (50 mg total) by mouth daily. 180 tablet 3  . metoprolol succinate (TOPROL-XL) 100 MG 24 hr tablet Take 1 tablet (100 mg total) by mouth daily. Take with or immediately following a meal. 90 tablet 3  . Propylene Glycol (SYSTANE BALANCE) 0.6 % SOLN Apply 1 drop to eye at bedtime.     No current facility-administered medications for this visit.  Allergies:   Asa arthritis strength-antacid [aspirin buffered] and Penicillins   Social History:  The patient  reports that he has never smoked. He has never used smokeless tobacco. He reports that he does not drink alcohol or use drugs.   Family History:  The patient's  family history includes Arthritis in an other family member; Breast cancer in his maternal grandmother and mother; Cancer in an other family member.   ROS:  Please see the history of present illness.   All other systems are personally reviewed and negative.    Exam:    Vital Signs:  BP (!) 137/56   Pulse (!) 55   Well wounding    Labs/Other Tests and Data Reviewed:    Recent Labs: 05/25/2018: B Natriuretic Peptide 1,048.3; Hemoglobin 14.1; Magnesium 1.8; Platelets 184; TSH 4.713 06/22/2018: BUN 14; Creatinine, Ser 1.12; Potassium 5.2; Sodium 139   Wt Readings from Last 3 Encounters:  06/08/18 199 lb 3.2 oz (90.4 kg)  05/27/18 203 lb 3.2 oz (92.2 kg)  05/21/18 205 lb (93 kg)     Other studies personally reviewed: Additional studies/ records that were reviewed today include: my prior notes  Review of the above records today demonstrates: as above   ASSESSMENT & PLAN:    1.  Paroxysmal atrial fibrillation/ h/o atach Well controlled with amiodarone Will need lfts, tfts on follow-up with Dr Stanford Breed in June On eliquis  2. HTN Stable No change required today  3. Cardiomyopathy Echo ordered by Dr Stanford Breed Continue medical therapy  4. COVID 19 screen The patient denies symptoms of COVID 19 at this time.  The importance of social distancing was discussed today.  Follow-up:  6 months  Current medicines are reviewed at length with the patient today.   The patient does not have concerns regarding his medicines.  The following changes were made today:  none  Labs/ tests ordered today include:  No orders of the defined types were placed in this encounter.   Patient Risk:  after full review of this patients clinical status, I feel that they are at moderate risk at this time.  Today, I have spent 22 minutes with the patient with telehealth technology discussing afib .    Army Fossa, MD  08/26/2018 8:10 AM     [facility] [address] [place] [address] [phone] (office) [phone] (fax)

## 2018-10-01 DIAGNOSIS — H2512 Age-related nuclear cataract, left eye: Secondary | ICD-10-CM | POA: Diagnosis not present

## 2018-10-02 ENCOUNTER — Telehealth (HOSPITAL_COMMUNITY): Payer: Self-pay | Admitting: *Deleted

## 2018-10-02 NOTE — Telephone Encounter (Signed)
COVID-19 Pre-Screening Questions:  . Do you currently have a fever? NO (yes = cancel and refer to pcp for e-visit) . Have you recently travelled on a cruise, internationally, or to [place], [place], [place], [place], [place], or [place], [place] ([place]) ? NO (yes = cancel, stay home, monitor symptoms, and contact pcp or initiate e-visit if symptoms develop) . Have you been in contact with someone that is currently pending confirmation of Covid19 testing or has been confirmed to have the Covid19 virus?  NO (yes = cancel, stay home, away from tested individual, monitor symptoms, and contact pcp or initiate e-visit if symptoms develop) . Are you currently experiencing fatigue or cough? NO (yes = pt should be prepared to have a mask placed at the time of their visit).   . Reiterated no additional visitors. . Arrive no earlier than 15 minutes before appointment time. . Please bring own mask.  Patrick [name] 

## 2018-10-05 ENCOUNTER — Telehealth: Payer: Self-pay | Admitting: Cardiology

## 2018-10-05 ENCOUNTER — Ambulatory Visit (HOSPITAL_COMMUNITY): Payer: Medicare Other | Attending: Cardiology

## 2018-10-05 ENCOUNTER — Other Ambulatory Visit: Payer: Self-pay

## 2018-10-05 DIAGNOSIS — I428 Other cardiomyopathies: Secondary | ICD-10-CM | POA: Diagnosis not present

## 2018-10-05 NOTE — Telephone Encounter (Signed)
home phone/ consent/ my chart/ pre reg completed °

## 2018-10-12 ENCOUNTER — Other Ambulatory Visit: Payer: Self-pay

## 2018-10-12 ENCOUNTER — Encounter: Payer: Self-pay | Admitting: Cardiology

## 2018-10-12 ENCOUNTER — Telehealth (INDEPENDENT_AMBULATORY_CARE_PROVIDER_SITE_OTHER): Payer: Medicare Other | Admitting: Cardiology

## 2018-10-12 VITALS — BP 113/67 | HR 67 | Ht 74.0 in | Wt 195.0 lb

## 2018-10-12 DIAGNOSIS — I428 Other cardiomyopathies: Secondary | ICD-10-CM

## 2018-10-12 DIAGNOSIS — I480 Paroxysmal atrial fibrillation: Secondary | ICD-10-CM | POA: Diagnosis not present

## 2018-10-12 DIAGNOSIS — I119 Hypertensive heart disease without heart failure: Secondary | ICD-10-CM

## 2018-10-12 DIAGNOSIS — I471 Supraventricular tachycardia: Secondary | ICD-10-CM

## 2018-10-12 NOTE — Progress Notes (Signed)
Virtual Visit via Video Note   This visit type was conducted due to national recommendations for restrictions regarding the COVID-19 Pandemic (e.g. social distancing) in an effort to limit this patient's exposure and mitigate transmission in our community.  Due to his co-morbid illnesses, this patient is at least at moderate risk for complications without adequate follow up.  This format is felt to be most appropriate for this patient at this time.  All issues noted in this document were discussed and addressed.  A limited physical exam was performed with this format.  Please refer to the patient's chart for his consent to telehealth for [facility].   Date:  10/12/2018   ID:  Patrick [name], DOB 1940-11-21, MRN [id number]  Patient Location: Home Provider Location: Home  PCP:  Velna Hatchet, MD  Cardiologist:  Dr Stanford Breed  Evaluation Performed:  Follow-Up Visit  Chief Complaint:  FU CM and atrial fibrillation  History of Present Illness:    FU CM and atrial fibrillation. Patient has had previous ablation of atrial tachycardia in 2011. Patient seen December 2019 with complaints of palpitations. He was found to be in atrial tachycardia. Patient was placed on beta-blocker. He was felt to be at increased risk for repeat ablationgivenhistory of rheumatic fever. Echo was ordered and showed newly reduced LV function with ejection fraction 20 to 25%, mild left ventricular hypertrophy, mild mitral regurgitation. His heart rate during the study was 120-160.Had TEE on May 18, 2018. Ejection fraction 20 to 25%, mild mitral regurgitation, no left atrial appendage thrombus. Patient successfully cardioverted. Note Dr. [name] had planned to admit afterwards and begin Tikosyn but patient declined as his wife had a cardiac arrest on Tikosyn. Found to be in atrial fibrillation at follow-up office visit.  Amiodarone initiated and patient converted to sinus rhythm.  Seen with left upper  extremity pain January 27.  CTA showed no dissection or pulmonary thromboembolism.  Troponins were normal.  TSH 4.713. Echocardiogram repeated June 2020 and showed normal LV function.  There was mild left atrial enlargement and trace aortic insufficiency.  Since last seen the patient denies any dyspnea on exertion, orthopnea, PND, pedal edema, palpitations, syncope or chest pain.   The patient does not have symptoms concerning for COVID-19 infection (fever, chills, cough, or new shortness of breath).    Past Medical History:  Diagnosis Date   Allergy    Atrial tachycardia ([place])    Blood transfusion 1944   Cataract    Diverticulosis    Heart murmur    Hemorrhoids    Hyperlipemia    Prostate cancer ([place])    Gleason 6 status post radiation seed implant September 2010   Rheumatic heart disease    78yrs old   Past Surgical History:  Procedure Laterality Date   CARDIAC ELECTROPHYSIOLOGY STUDY AND ABLATION  2011   for SVT   CARDIOVERSION N/A 05/18/2018   Procedure: CARDIOVERSION;  Surgeon: Buford Dresser, MD;  Location: [facility] ENDOSCOPY;  Service: Cardiovascular;  Laterality: N/A;   CATARACT EXTRACTION W/ INTRAOCULAR LENS IMPLANT  2009   right   INGUINAL HERNIA REPAIR  01/2006   right   MELANOMA EXCISION  2011   superficial resection left maxillary area/ Dr [name]   TEE WITHOUT CARDIOVERSION N/A 05/18/2018   Procedure: TRANSESOPHAGEAL ECHOCARDIOGRAM (TEE);  Surgeon: Buford Dresser, MD;  Location: [place];  Service: Cardiovascular;  Laterality: N/A;   [place]  1/11   s/p left  WRIST FUSION  2006   04/2004     Current Meds  Medication Sig   acetaminophen (TYLENOL) 500 MG tablet Take 500 mg by mouth every 6 (six) hours as needed (shoulder pain).    amiodarone (PACERONE) 200 MG tablet Take 200 mg by mouth daily.   apixaban (ELIQUIS) 5 MG TABS tablet Take 1 tablet (5 mg total) by mouth 2  (two) times daily.   losartan (COZAAR) 25 MG tablet Take 2 tablets (50 mg total) by mouth daily.   metoprolol succinate (TOPROL-XL) 100 MG 24 hr tablet Take 1 tablet (100 mg total) by mouth daily. Take with or immediately following a meal.   Propylene Glycol (SYSTANE BALANCE) 0.6 % SOLN Apply 1 drop to eye at bedtime.     Allergies:   Asa arthritis strength-antacid [aspirin buffered] and Penicillins   Social History   Tobacco Use   Smoking status: Never Smoker   Smokeless tobacco: Never Used  Substance Use Topics   Alcohol use: No   Drug use: No     Family Hx: The patient's family history includes Arthritis in an other family member; Breast cancer in his maternal grandmother and mother; Cancer in an other family member. There is no history of Colon cancer, Esophageal cancer, Pancreatic cancer, Rectal cancer, or Stomach cancer.  ROS:   Please see the history of present illness.    No fevers, chills or productive cough. All other systems reviewed and are negative.   Labs/Other Tests and Data Reviewed:     Recent Labs: 05/25/2018: B Natriuretic Peptide 1,048.3; Hemoglobin 14.1; Magnesium 1.8; Platelets 184; TSH 4.713 06/22/2018: BUN 14; Creatinine, Ser 1.12; Potassium 5.2; Sodium 139   Recent Lipid Panel Lab Results  Component Value Date/Time   CHOL 185 11/09/2012 09:37 AM   TRIG 176.0 (H) 11/09/2012 09:37 AM   HDL 38.80 (L) 11/09/2012 09:37 AM   CHOLHDL 5 11/09/2012 09:37 AM   LDLCALC 111 (H) 11/09/2012 09:37 AM   LDLDIRECT 128.8 10/11/2009 09:20 AM    Wt Readings from Last 3 Encounters:  10/12/18 195 lb (88.5 kg)  06/08/18 199 lb 3.2 oz (90.4 kg)  05/27/18 203 lb 3.2 oz (92.2 kg)     Objective:    Vital Signs:  BP 113/67    Pulse 67    Ht 6\' 2"  (1.88 m)    Wt 195 lb (88.5 kg)    BMI 25.04 kg/m    VITAL SIGNS:  reviewed  Patient answers questions appropriately. No acute distress Normal affect Remainder of physical examination not performed (telehealth  visit; coronavirus pandemic)  ASSESSMENT & PLAN:    1. Paroxysmal atrial fibrillation-patient's most recent electrocardiogram showed sinus rhythm.  Plan to continue medical therapy with amiodarone and Toprol.  Check TSH, liver functions and chest x-ray.  Continue apixaban.  Check hemoglobin and renal function. 2. Cardiomyopathy-LV function severely reduced previously but has normalized on most recent echocardiogram.  Likely tachycardia mediated.  Continue beta-blocker and ARB. 3. Hypertension-blood pressure is controlled.  Continue present medications and follow. 4. History of atrial tachycardia-status post ablation.  COVID-19 Education: The importance of social distancing was discussed today.  Time:   Today, I have spent 18 minutes with the patient with telehealth technology discussing the above problems.     Medication Adjustments/Labs and Tests Ordered: Current medicines are reviewed at length with the patient today.  Concerns regarding medicines are outlined above.   Tests Ordered: No orders of the defined types were placed in this encounter.  Medication Changes: No orders of the defined types were placed in this encounter.   Follow Up:  Virtual Visit or In Person in 6 month(s)  Signed, Kirk Ruths, MD  10/12/2018 7:53 AM    [place]

## 2018-10-12 NOTE — Patient Instructions (Addendum)
Medication Instructions:  NO CHANGE If you need a refill on your cardiac medications before your next appointment, please call your pharmacy.   Lab work: Your physician recommends that you return for lab work WHEN CONVENIENT  If you have labs (blood work) drawn today and your tests are completely normal, you will receive your results only by: Marland Kitchen MyChart Message (if you have MyChart) OR . A paper copy in the mail If you have any lab test that is abnormal or we need to change your treatment, we will call you to review the results.  Testing/Procedures: A chest x-ray takes a picture of the organs and structures inside the chest, including the heart, lungs, and blood vessels. This test can show several things, including, whether the heart is enlarges; whether fluid is building up in the lungs; and whether pacemaker / defibrillator leads are still in place. [place]  Follow-Up: At [facility], you and your health needs are our priority.  As part of our continuing mission to provide you with exceptional heart care, we have created designated Provider Care Teams.  These Care Teams include your primary Cardiologist (physician) and Advanced Practice Providers (APPs -  Physician Assistants and Nurse Practitioners) who all work together to provide you with the care you need, when you need it. You will need a follow up appointment in 12 months.  Please call our office 2 months in advance to schedule this appointment.  You may see Kirk Ruths MD or one of the following Advanced Practice Providers on your designated Care Team:   Kerin Ransom, PA-C Roby Lofts, [place] . Sande Rives, PA-C

## 2018-10-14 ENCOUNTER — Other Ambulatory Visit: Payer: Self-pay

## 2018-10-14 ENCOUNTER — Ambulatory Visit
Admission: RE | Admit: 2018-10-14 | Discharge: 2018-10-14 | Disposition: A | Discharge status: Home / Self Care | Payer: Medicare Other | Source: Ambulatory Visit | Attending: Cardiology | Admitting: Cardiology

## 2018-10-14 DIAGNOSIS — Z79899 Other long term (current) drug therapy: Secondary | ICD-10-CM | POA: Diagnosis not present

## 2018-10-14 DIAGNOSIS — I480 Paroxysmal atrial fibrillation: Secondary | ICD-10-CM

## 2018-10-19 DIAGNOSIS — I480 Paroxysmal atrial fibrillation: Secondary | ICD-10-CM | POA: Diagnosis not present

## 2018-10-19 LAB — BASIC METABOLIC PANEL
BUN/Creatinine Ratio: 12 (ref 10–24)
BUN: 18 mg/dL (ref 8–27)
CO2: 24 mmol/L (ref 20–29)
Calcium: 9.7 mg/dL (ref 8.6–10.2)
Chloride: 100 mmol/L (ref 96–106)
Creatinine, Ser: 1.49 mg/dL — ABNORMAL HIGH (ref 0.76–1.27)
GFR calc Af Amer: 52 mL/min/{1.73_m2} — ABNORMAL LOW (ref >59)
GFR calc non Af Amer: 45 mL/min/{1.73_m2} — ABNORMAL LOW (ref >59)
Glucose: 96 mg/dL (ref 65–99)
Potassium: 4.8 mmol/L (ref 3.5–5.2)
Sodium: 141 mmol/L (ref 134–144)

## 2018-10-19 LAB — CBC
Hematocrit: 41.5 % (ref 37.5–51.0)
Hemoglobin: 14.1 g/dL (ref 13.0–17.7)
MCH: 34.3 pg — ABNORMAL HIGH (ref 26.6–33.0)
MCHC: 34 g/dL (ref 31.5–35.7)
MCV: 101 fL — ABNORMAL HIGH (ref 79–97)
Platelets: 212 10*3/uL (ref 150–450)
RBC: 4.11 x10E6/uL — ABNORMAL LOW (ref 4.14–5.80)
RDW: 12.4 % (ref 11.6–15.4)
WBC: 5.6 10*3/uL (ref 3.4–10.8)

## 2018-10-19 LAB — HEPATIC FUNCTION PANEL
ALT: 13 IU/L (ref 0–44)
AST: 21 IU/L (ref 0–40)
Albumin: 4.1 g/dL (ref 3.7–4.7)
Alkaline Phosphatase: 71 IU/L (ref 39–117)
Bilirubin Total: 0.7 mg/dL (ref 0.0–1.2)
Bilirubin, Direct: 0.13 mg/dL (ref 0.00–0.40)
Total Protein: 7.1 g/dL (ref 6.0–8.5)

## 2018-11-04 ENCOUNTER — Telehealth: Payer: Self-pay | Admitting: Cardiology

## 2018-11-04 NOTE — Telephone Encounter (Signed)
Called patient, he states starting last night he noticed his BP dropped low last night around 8:00 PM it was 92/58, when he got up and tried to go to bed he had to hold onto the wall- as he felt weak and dizzy, he went to sleep but woke up this morning and still didn't feel right- still had the weakness and rechecked his BP and it was 94/57 HR 63 this morning. He had sweats last night, none today- and states that he does not feel as bad today but still thinks he should call and get advice. Patient did mention he fell yesterday- due to his feet getting tangled up under him not due to being dizzy, as he was working outside yesterday afternoon. He has only taken his Eliquis and Amiodarone this morning. He takes the Metoprolol and the Losartan at night which he did take last night. Patient was advised for today to increase his water intake- and to make sure he is staying hydrated while working outside, but patient states he would like advice on if he should take the medications tonight or not. Patient mentions he feels more dizzy when he stands up, and feels fine if he is sitting- gave instructions on how to check orthostatic BP's and let us know how those look to see if BP drops when standing.   Advised I would route message to PharmD to advise on medication- and MD for any other recommendations at this time.

## 2018-11-04 NOTE — Telephone Encounter (Signed)
Increase fluid intake Patrick [name]

## 2018-11-04 NOTE — Telephone Encounter (Signed)
Called patient and notified.  Patient verbalized understanding.  

## 2018-11-04 NOTE — Telephone Encounter (Signed)
Pt c/o BP issue: STAT if pt c/o blurred vision, one-sided weakness or slurred speech  1. What are your last 5 BP readings? 92/57  2. Are you having any other symptoms (ex. Dizziness, headache, blurred vision, passed out)? Dizziness weak past out  3. What is your BP issue? Patient says BP running very low it may be do to some medication.

## 2018-11-04 NOTE — Telephone Encounter (Signed)
Discontinue losartan and decrease metoprolol to 50 mg daily.  Follow blood pressure and contact with further problems. Kirk Ruths

## 2018-11-04 NOTE — Telephone Encounter (Signed)
Called and notified patient of recommendations-  Patient verbalized understanding, he states he did check his BP sitting it was 118/56 HR 58 and when he stood and waited then checked it was 76/55 HR 69. Wanted to make you aware.  Thanks!

## 2018-11-06 NOTE — Telephone Encounter (Signed)
Decrease Toprol to 25 mg daily.  APP office visit Kirk Ruths

## 2018-11-06 NOTE — Telephone Encounter (Signed)
Follow up    Patient is calling because his BP is still on the low side. This morning his BP standing is 87/56 sitting 120/62. Please call to discuss.

## 2018-11-06 NOTE — Telephone Encounter (Signed)
Called patient advised of med changes, patient verbalized understanding.   Made appointment with NP next Tuesday- patient aware of appointment, answer no to all covid questions, and aware to wear mask.   Will route to NP and assistant to make aware for appointment.

## 2018-11-06 NOTE — Telephone Encounter (Signed)
Patient called back- states that he made the changes a few days ago- but has continued to have low BP. He states the dizziness is better, but the BP continues to drop when he stands up.  This morning sitting he was 103/51 HR 80, then standing it was 87/56 HR 88.  He states early this morning around 7:00 his BP dropped to the lowest of 65/47 HR 88.  He has been increasing his fluid- but states he did feel extremely thirsty this morning and drank many cups of water. Patient was advised to continue to drink fluids- eat salt to bring up BP. Advised I would route to MD to see if APP appointment is needed.

## 2018-11-07 DIAGNOSIS — I119 Hypertensive heart disease without heart failure: Secondary | ICD-10-CM | POA: Diagnosis not present

## 2018-11-08 NOTE — Progress Notes (Signed)
Cardiology Office Note   Date:  11/08/2018   ID:  Patrick [name], Patrick [name] 10/31/1940, MRN [id number]  PCP:  Velna Hatchet, MD  Cardiologist:  [place]  CC: Hypotension with dizziness    History of Present Illness: Patrick [name] is a 78 y.o. male who presents for ongoing assessment and management of atrial fibrillation, history of rheumatic fever, nonischemic cardiomyopathy with an EF of 20% to 25%, HFrEF.  Repeat echocardiogram in June 2020 revealed normal LV function with mild left atrial enlargement and trace aortic insufficiency.  The patient called our office on 11/04/2018 with complaints of hypotension with weakness and near syncope.  He had been medically compliant with Eliquis and amiodarone, and is on metoprolol and losartan for hypertension.  The patient had been working outside.  Losartan was discontinued and metoprolol was decreased to 50 mg daily as addressed by Dr. Stanford Breed on 11/04/2018.  The patient states that he rechecked his blood pressure and found it to be 118/56 sitting, and with standing it dropped to 76/55.  It was advised to make an appointment to be seen in person.  Orthostatics were completed today. BP diid  drop to 88/60 from 135/74. He is feeling tired and dizzy. HE fell in his yard this week and sustained a large area of bleeding. Ecchymosis is prominent in the underside of his thigh on the left.     Past Medical History:  Diagnosis Date  . Allergy   . Atrial tachycardia ([place])   . Blood transfusion 1944  . Cataract   . Diverticulosis   . Heart murmur   . Hemorrhoids   . Hyperlipemia   . Prostate cancer [facility])    Gleason 6 status post radiation seed implant September 2010  . Rheumatic heart disease    78yrs old    Past Surgical History:  Procedure Laterality Date  . CARDIAC ELECTROPHYSIOLOGY STUDY AND ABLATION  2011   for SVT  . CARDIOVERSION N/A 05/18/2018   Procedure: CARDIOVERSION;  Surgeon: Buford Dresser, MD;  Location: [place];  Service:  Cardiovascular;  Laterality: N/A;  . CATARACT EXTRACTION W/ INTRAOCULAR LENS IMPLANT  2009   right  . INGUINAL HERNIA REPAIR  01/2006   right  . MELANOMA EXCISION  2011   superficial resection left maxillary area/ Dr [name]  . TEE WITHOUT CARDIOVERSION N/A 05/18/2018   Procedure: TRANSESOPHAGEAL ECHOCARDIOGRAM (TEE);  Surgeon: Buford Dresser, MD;  Location: [facility] ENDOSCOPY;  Service: Cardiovascular;  Laterality: N/A;  . [place]  . TOTAL KNEE ARTHROPLASTY  1/11   s/p left  . WRIST FUSION  2006   04/2004     Current Outpatient Medications  Medication Sig Dispense Refill  . acetaminophen (TYLENOL) 500 MG tablet Take 500 mg by mouth every 6 (six) hours as needed (shoulder pain).     Marland Kitchen amiodarone (PACERONE) 200 MG tablet Take 200 mg by mouth daily.    Marland Kitchen apixaban (ELIQUIS) 5 MG TABS tablet Take 1 tablet (5 mg total) by mouth 2 (two) times daily. 60 tablet 6  . losartan (COZAAR) 25 MG tablet Take 2 tablets (50 mg total) by mouth daily. 180 tablet 3  . metoprolol succinate (TOPROL-XL) 100 MG 24 hr tablet Take 1 tablet (100 mg total) by mouth daily. Take with or immediately following a meal. 90 tablet 3  . Propylene Glycol (SYSTANE BALANCE) 0.6 % SOLN Apply 1 drop to eye at bedtime.     No current facility-administered medications for this visit.  Allergies:   Asa arthritis strength-antacid [aspirin buffered] and Penicillins    Social History:  The patient  reports that he has never smoked. He has never used smokeless tobacco. He reports that he does not drink alcohol or use drugs.   Family History:  The patient's family history includes Arthritis in an other family member; Breast cancer in his maternal grandmother and mother; Cancer in an other family member.    ROS: All other systems are reviewed and negative. Unless otherwise mentioned in H&P    PHYSICAL EXAM: VS:  There were no vitals taken for this visit. , BMI There is no height or weight on  file to calculate BMI. GEN: Well nourished, well developed, in no acute distress HEENT: normal Neck: no JVD, carotid bruits, or masses Cardiac RRR; no murmurs, rubs, or gallops,no edema  Respiratory:  Clear to auscultation bilaterally, normal work of breathing GI: soft, nontender, nondistended, + BS MS: Large area of hematoma to the underside of the left thigh.It is painful to touch.  Skin: warm and dry, no rash Neuro:  Strength and sensation are intact Psych: euthymic mood, full affect   EKG: NSR, Mild LVH. HR of 82 bpm.   Recent Labs: 05/25/2018: B Natriuretic Peptide 1,048.3; Magnesium 1.8; TSH 4.713 10/19/2018: ALT 13; BUN 18; Creatinine, Ser 1.49; Hemoglobin 14.1; Platelets 212; Potassium 4.8; Sodium 141    Lipid Panel    Component Value Date/Time   CHOL 185 11/09/2012 0937   TRIG 176.0 (H) 11/09/2012 0937   HDL 38.80 (L) 11/09/2012 0937   CHOLHDL 5 11/09/2012 0937   VLDL 35.2 11/09/2012 0937   LDLCALC 111 (H) 11/09/2012 0937   LDLDIRECT 128.8 10/11/2009 0920      Wt Readings from Last 3 Encounters:  10/12/18 195 lb (88.5 kg)  06/08/18 199 lb 3.2 oz (90.4 kg)  05/27/18 203 lb 3.2 oz (92.2 kg)      Other studies Reviewed:  Echocardiogram 10/05/2018 1. The left ventricle has normal systolic function, with an ejection fraction of 55-60%. The cavity size was normal. mild basal septal hypertrophy. Left ventricular diastolic parameters were normal. No evidence of left ventricular regional wall motion  abnormalities.  2. The right ventricle has normal systolic function. The cavity was normal. There is no increase in right ventricular wall thickness.  3. Left atrial size was mildly dilated.  4. The aortic valve is tricuspid. Mild sclerosis of the aortic valve. Aortic valve regurgitation is trivial by color flow Doppler. 5. Compared to prior echo, LVF has improved.   ASSESSMENT AND PLAN:  1. Hypotension:  Has orthostatic BP despite medication changes.  He has had his  losartan discontinued and decreased doses of metoprolol but continues to have some dizziness and hypotension.  This is confirmed with orthostatics completed in the office today.  I would like to discontinue the metoprolol but have checked in with Dr. Martin[place], Harris at the office today, as the patient's heart rate has gone up with reduction of metoprolol.  He continues on amiodarone 200 mg daily.  After discussion with Dr. Martinique we are discontinuing the metoprolol.  If heart rate goes up or he does go back in atrial fibrillation the amiodarone should keep it rate controlled.  He is advised to increase his salt intake and wear support hose.  I am going to check a TSH to evaluate level on amiodarone.  He has had recent labs and LFTs are normal but TSH has not been evaluated.  The patient will follow up  with Dr. Stanford Breed on next office visit.  2.  Chronic dizziness: This is likely from hypotension.  However he is requesting something to help him with dizziness and I will give him low-dose Antivert 12.5 mg which she can take as needed for dizziness.  3.  Atrial fibrillation: The patient remains in sinus rhythm currently, heart rate of 82 bpm.  He continues on Eliquis.  We will reevaluate his response to discontinuation of metoprolol on follow-up.  The patient feels rapid heart rhythm, irregular heart rhythm he is to call.  4.  Left thigh hematoma with ecchymosis.  Per patient report, he originally had some swelling, but this is gone down on his own he now has a large area of ecchymosis radiating distally to the knee on the backside of the left thigh.  It is still tender.  He is been using warm moist compresses for comfort.  The patient stated that he lost his balance while working out in his yard and fell.  He is able to bear weight on his leg and walk without a limp.  He just has some soreness from the large area of ecchymosis.  I will check a CBC.  Current medicines are reviewed at length with the patient  today.    Labs/ tests ordered today include: TSH, CBC  Phill Myron. [place] Pugh, ANP, AACC   11/08/2018 [facility] [address] [address] Office [phone] Fax [phone]

## 2018-11-09 ENCOUNTER — Ambulatory Visit (INDEPENDENT_AMBULATORY_CARE_PROVIDER_SITE_OTHER): Payer: Medicare Other | Admitting: Adult Health

## 2018-11-09 ENCOUNTER — Other Ambulatory Visit: Payer: Self-pay

## 2018-11-09 ENCOUNTER — Encounter: Payer: Self-pay | Admitting: Adult Health

## 2018-11-09 VITALS — Ht 74.0 in | Wt 205.0 lb

## 2018-11-09 DIAGNOSIS — S8012XA Contusion of left lower leg, initial encounter: Secondary | ICD-10-CM | POA: Diagnosis not present

## 2018-11-09 DIAGNOSIS — Z79899 Other long term (current) drug therapy: Secondary | ICD-10-CM

## 2018-11-09 DIAGNOSIS — I480 Paroxysmal atrial fibrillation: Secondary | ICD-10-CM | POA: Diagnosis not present

## 2018-11-09 DIAGNOSIS — R5383 Other fatigue: Secondary | ICD-10-CM | POA: Diagnosis not present

## 2018-11-09 DIAGNOSIS — I952 Hypotension due to drugs: Secondary | ICD-10-CM | POA: Diagnosis not present

## 2018-11-09 MED ORDER — MECLIZINE HCL 12.5 MG PO TABS: 12.5000 mg | ORAL_TABLET | Freq: Three times a day (TID) | ORAL | 6 refills | Status: DC | PRN

## 2018-11-09 NOTE — Patient Instructions (Addendum)
Medication Instructions:  STOP- Metoprolol  If you need a refill on your cardiac medications before your next appointment, please call your pharmacy.  Labwork: BMP, TSH, CBC HERE IN OUR OFFICE AT [facility] You will NOT need to fast   Take the provided lab slips with you to the lab for your blood draw.   When you have your labs (blood work) drawn today and your tests are completely normal, you will receive your results only by MyChart Message (if you have MyChart) -OR-  A paper copy in the mail.  If you have any lab test that is abnormal or we need to change your treatment, we will call you to review these results.  Testing/Procedures: None Ordered  Follow-Up: . Your physician recommends that you schedule a follow-up appointment in: Next available Dr Stanford Breed  At [facility], you and your health needs are our priority.  As part of our continuing mission to provide you with exceptional heart care, we have created designated Provider Care Teams.  These Care Teams include your primary Cardiologist (physician) and Advanced Practice Providers (APPs -  Physician Assistants and Nurse Practitioners) who all work together to provide you with the care you need, when you need it.  Thank you for choosing [facility] at [facility]!!

## 2018-11-10 ENCOUNTER — Ambulatory Visit: Payer: Medicare Other | Admitting: Adult Health

## 2018-11-10 ENCOUNTER — Other Ambulatory Visit (INDEPENDENT_AMBULATORY_CARE_PROVIDER_SITE_OTHER): Payer: Medicare Other

## 2018-11-10 DIAGNOSIS — I119 Hypertensive heart disease without heart failure: Secondary | ICD-10-CM

## 2018-11-10 DIAGNOSIS — S8010XA Contusion of unspecified lower leg, initial encounter: Secondary | ICD-10-CM | POA: Diagnosis not present

## 2018-11-10 DIAGNOSIS — D62 Acute posthemorrhagic anemia: Secondary | ICD-10-CM | POA: Diagnosis not present

## 2018-11-10 DIAGNOSIS — I959 Hypotension, unspecified: Secondary | ICD-10-CM | POA: Diagnosis not present

## 2018-11-10 LAB — CBC
Hematocrit: 25.4 % — ABNORMAL LOW (ref 37.5–51.0)
Hemoglobin: 9 g/dL — ABNORMAL LOW (ref 13.0–17.7)
MCH: 35 pg — ABNORMAL HIGH (ref 26.6–33.0)
MCHC: 35.4 g/dL (ref 31.5–35.7)
MCV: 99 fL — ABNORMAL HIGH (ref 79–97)
Platelets: 234 10*3/uL (ref 150–450)
RBC: 2.57 x10E6/uL — CL (ref 4.14–5.80)
RDW: 11.9 % (ref 11.6–15.4)
WBC: 7.1 10*3/uL (ref 3.4–10.8)

## 2018-11-10 LAB — BASIC METABOLIC PANEL
BUN/Creatinine Ratio: 15 (ref 10–24)
BUN: 20 mg/dL (ref 8–27)
CO2: 24 mmol/L (ref 20–29)
Calcium: 8.5 mg/dL — ABNORMAL LOW (ref 8.6–10.2)
Chloride: 101 mmol/L (ref 96–106)
Creatinine, Ser: 1.33 mg/dL — ABNORMAL HIGH (ref 0.76–1.27)
GFR calc Af Amer: 59 mL/min/{1.73_m2} — ABNORMAL LOW (ref >59)
GFR calc non Af Amer: 51 mL/min/{1.73_m2} — ABNORMAL LOW (ref >59)
Glucose: 112 mg/dL — ABNORMAL HIGH (ref 65–99)
Potassium: 4.7 mmol/L (ref 3.5–5.2)
Sodium: 137 mmol/L (ref 134–144)

## 2018-11-10 LAB — TSH: TSH: 3.42 u[IU]/mL (ref 0.450–4.500)

## 2018-11-12 DIAGNOSIS — S7012XA Contusion of left thigh, initial encounter: Secondary | ICD-10-CM | POA: Diagnosis not present

## 2018-11-13 ENCOUNTER — Telehealth: Payer: Self-pay | Admitting: Cardiology

## 2018-11-13 NOTE — Telephone Encounter (Signed)
Spoke with Patrick [name], patients eliquis was stopped due to sudden drop in HGB. He had a hematoma on his leg from a fall and [facility] and has been cleared by them to restart eliquis. The medical doctor would like dr Jacalyn Lefevre input regarding restarting the eliquis. The medical doctor feels restarting will be fine.Will forward for dr Stanford Breed review

## 2018-11-13 NOTE — Telephone Encounter (Signed)
Left message for taylor with dr Jacalyn Lefevre recommendation.

## 2018-11-13 NOTE — Telephone Encounter (Signed)
New Message    Pt c/o medication issue:  1. Name of Medication: Eliquis  2. How are you currently taking this medication (dosage and times per day)? 5mg   3. Are you having a reaction (difficulty breathing--STAT)?    4. What is your medication issue? Patient was cleared by [facility] to start taking medication again and [place] calling to make sure it's ok with Dr. Stanford Breed. If Lovena Le isn't available ok to leave answer on her voicemail.

## 2018-11-13 NOTE — Telephone Encounter (Signed)
Ok to resume apixaban if ok with primary care and bleeding has stopped Patrick [name]

## 2018-11-16 DIAGNOSIS — D62 Acute posthemorrhagic anemia: Secondary | ICD-10-CM | POA: Diagnosis not present

## 2018-11-30 DIAGNOSIS — S8010XA Contusion of unspecified lower leg, initial encounter: Secondary | ICD-10-CM | POA: Diagnosis not present

## 2018-11-30 DIAGNOSIS — D62 Acute posthemorrhagic anemia: Secondary | ICD-10-CM | POA: Diagnosis not present

## 2018-11-30 DIAGNOSIS — I959 Hypotension, unspecified: Secondary | ICD-10-CM | POA: Diagnosis not present

## 2018-12-03 ENCOUNTER — Other Ambulatory Visit: Payer: Self-pay | Admitting: Cardiology

## 2018-12-03 DIAGNOSIS — D62 Acute posthemorrhagic anemia: Secondary | ICD-10-CM | POA: Diagnosis not present

## 2018-12-03 DIAGNOSIS — I4819 Other persistent atrial fibrillation: Secondary | ICD-10-CM

## 2018-12-03 NOTE — Telephone Encounter (Signed)
63m 90.4kg Scr 1.33 Lovw/lawrence 11/09/18

## 2019-01-05 DIAGNOSIS — Z125 Encounter for screening for malignant neoplasm of prostate: Secondary | ICD-10-CM | POA: Diagnosis not present

## 2019-01-05 DIAGNOSIS — E7849 Other hyperlipidemia: Secondary | ICD-10-CM | POA: Diagnosis not present

## 2019-01-07 DIAGNOSIS — I10 Essential (primary) hypertension: Secondary | ICD-10-CM | POA: Diagnosis not present

## 2019-01-07 DIAGNOSIS — R82998 Other abnormal findings in urine: Secondary | ICD-10-CM | POA: Diagnosis not present

## 2019-01-11 DIAGNOSIS — Z8546 Personal history of malignant neoplasm of prostate: Secondary | ICD-10-CM | POA: Diagnosis not present

## 2019-01-11 DIAGNOSIS — Z1331 Encounter for screening for depression: Secondary | ICD-10-CM | POA: Diagnosis not present

## 2019-01-11 DIAGNOSIS — E785 Hyperlipidemia, unspecified: Secondary | ICD-10-CM | POA: Diagnosis not present

## 2019-01-11 DIAGNOSIS — H6121 Impacted cerumen, right ear: Secondary | ICD-10-CM | POA: Diagnosis not present

## 2019-01-11 DIAGNOSIS — I4891 Unspecified atrial fibrillation: Secondary | ICD-10-CM | POA: Diagnosis not present

## 2019-01-11 DIAGNOSIS — I509 Heart failure, unspecified: Secondary | ICD-10-CM | POA: Diagnosis not present

## 2019-01-11 DIAGNOSIS — Z0000 Encounter for general adult medical examination without abnormal findings: Secondary | ICD-10-CM | POA: Diagnosis not present

## 2019-01-11 DIAGNOSIS — I110 Hypertensive heart disease with heart failure: Secondary | ICD-10-CM | POA: Diagnosis not present

## 2019-01-11 DIAGNOSIS — R42 Dizziness and giddiness: Secondary | ICD-10-CM | POA: Diagnosis not present

## 2019-01-11 DIAGNOSIS — Z86010 Personal history of colonic polyps: Secondary | ICD-10-CM | POA: Diagnosis not present

## 2019-01-11 DIAGNOSIS — D62 Acute posthemorrhagic anemia: Secondary | ICD-10-CM | POA: Diagnosis not present

## 2019-01-11 DIAGNOSIS — D692 Other nonthrombocytopenic purpura: Secondary | ICD-10-CM | POA: Diagnosis not present

## 2019-01-12 ENCOUNTER — Telehealth: Payer: Self-pay

## 2019-01-12 DIAGNOSIS — M2041 Other hammer toe(s) (acquired), right foot: Secondary | ICD-10-CM | POA: Diagnosis not present

## 2019-01-12 DIAGNOSIS — L84 Corns and callosities: Secondary | ICD-10-CM | POA: Diagnosis not present

## 2019-01-12 NOTE — Telephone Encounter (Signed)
   Primary Cardiologist: Kirk Ruths, MD Primary Electrophysiologist:  [name], MD   Chart reviewed as part of pre-operative protocol coverage.  Patient with a hx of persistent atrial fibrillation, atrial tachycardia s/p prior ablation, heart failure with reduced ejection fraction with recovered LV function secondary to tachycardia induced cardiomyopathy.  He is on Amiodarone for rhythm control and Apixaban (Eliquis) for anticoagulation.  Last seen in clinic with Arnold Long, NP in July 2020.    Simple dental extractions are considered low risk procedures per guidelines and generally do not require any specific cardiac clearance. It is also generally accepted that for simple extractions and dental cleanings, there is no need to interrupt blood thinner therapy.  SBE prophylaxis is not required for the patient.  PLAN:  1. Proceed with dental extraction.  Patient is at low risk. 2. Do NOT hold Eliquis (Apixaban). 3. Please call with questions.  Richardson Dopp, PA-C 01/12/2019, 10:12 AM

## 2019-01-12 NOTE — Telephone Encounter (Signed)
   [facility] Pre-operative Risk Assessment    Request for surgical clearance:  1. What type of surgery is being performed? Tooth extraction  2. When is this surgery scheduled? TBD  3. What type of clearance is required (medical clearance vs. Pharmacy clearance to hold med vs. Both)? Both  4. Are there any medications that need to be held prior to surgery and how long? Eliquis - unknown   5. Practice name and name of physician performing surgery? [facility]: Dr. Romie Minus  6. What is your office phone number?  [phone]   7.   What is your office fax number? [phone]  8.   Anesthesia type (None, local, MAC, general) ? unknown   [place] 01/12/2019, 7:22 AM  _________________________________________________________________   (provider comments below)

## 2019-01-14 DIAGNOSIS — Z1212 Encounter for screening for malignant neoplasm of rectum: Secondary | ICD-10-CM | POA: Diagnosis not present

## 2019-02-12 ENCOUNTER — Ambulatory Visit: Payer: Medicare Other | Admitting: Cardiology

## 2019-02-22 ENCOUNTER — Other Ambulatory Visit: Payer: Self-pay

## 2019-02-22 ENCOUNTER — Telehealth (INDEPENDENT_AMBULATORY_CARE_PROVIDER_SITE_OTHER): Payer: Medicare Other | Admitting: Internal Medicine

## 2019-02-22 DIAGNOSIS — I10 Essential (primary) hypertension: Secondary | ICD-10-CM

## 2019-02-22 NOTE — Progress Notes (Signed)
Did not answer for virtual visit  Left VM Will reschedule for another day.  [name] MD, [place] 02/22/2019 10:33 AM

## 2019-02-24 ENCOUNTER — Telehealth (INDEPENDENT_AMBULATORY_CARE_PROVIDER_SITE_OTHER): Payer: Medicare Other | Admitting: Internal Medicine

## 2019-02-24 ENCOUNTER — Encounter: Payer: Self-pay | Admitting: Internal Medicine

## 2019-02-24 ENCOUNTER — Other Ambulatory Visit: Payer: Self-pay

## 2019-02-24 VITALS — BP 121/91 | HR 131 | Ht 74.0 in | Wt 195.0 lb

## 2019-02-24 DIAGNOSIS — I10 Essential (primary) hypertension: Secondary | ICD-10-CM

## 2019-02-24 DIAGNOSIS — I480 Paroxysmal atrial fibrillation: Secondary | ICD-10-CM

## 2019-02-24 DIAGNOSIS — I471 Supraventricular tachycardia: Secondary | ICD-10-CM

## 2019-02-24 NOTE — Progress Notes (Signed)
Electrophysiology TeleHealth Note   Due to national recommendations of social distancing due to [place] 19, an audio telehealth visit is felt to be most appropriate for this patient at this time.  Verbal consent was obtained by me for the telehealth visit today.  The patient does not have capability for a virtual visit.  A phone visit is therefore required today.   Date:  02/24/2019   ID:  Patrick [name], DOB 1940-05-15, MRN [id number]  Location: patient's home  Provider location:  [facility]  Evaluation Performed: Follow-up visit  PCP:  Velna Hatchet, MD   Electrophysiologist:  Dr [name]  Chief Complaint:  palpitations  History of Present Illness:    Patrick [name] is a 78 y.o. male who presents via telehealth conferencing today.  Since last being seen in our clinic, the patient reports doing reasonably well.  He is grieving death of his wife (she died this past 09-Aug-2022).   He has occasional tachypalpitations.  He found his heart rate to be elevated this morning.  Today, he denies symptoms of palpitations, chest pain, shortness of breath,  lower extremity edema, dizziness, presyncope, or syncope.  The patient is otherwise without complaint today.  The patient denies symptoms of fevers, chills, cough, or new SOB worrisome for COVID 19.  Past Medical History:  Diagnosis Date  . Allergy   . Atrial tachycardia ([place])   . Blood transfusion 1944  . Cataract   . Diverticulosis   . Heart murmur   . Hemorrhoids   . Hyperlipemia   . Prostate cancer [facility])    Gleason 6 status post radiation seed implant September 2010  . Rheumatic heart disease    78yrs old    Past Surgical History:  Procedure Laterality Date  . CARDIAC ELECTROPHYSIOLOGY STUDY AND ABLATION  2011   for SVT  . CARDIOVERSION N/A 05/18/2018   Procedure: CARDIOVERSION;  Surgeon: Buford Dresser, MD;  Location: [facility];  Service: Cardiovascular;  Laterality: N/A;  . CATARACT EXTRACTION W/ INTRAOCULAR LENS  IMPLANT  2009   right  . INGUINAL HERNIA REPAIR  01/2006   right  . MELANOMA EXCISION  2011   superficial resection left maxillary area/ Dr [name]  . TEE WITHOUT CARDIOVERSION N/A 05/18/2018   Procedure: TRANSESOPHAGEAL ECHOCARDIOGRAM (TEE);  Surgeon: Buford Dresser, MD;  Location: [facility] ENDOSCOPY;  Service: Cardiovascular;  Laterality: N/A;  . [place]  . TOTAL KNEE ARTHROPLASTY  1/11   s/p left  . WRIST FUSION  2006   04/2004    Current Outpatient Medications  Medication Sig Dispense Refill  . acetaminophen (TYLENOL) 500 MG tablet Take 500 mg by mouth every 6 (six) hours as needed (shoulder pain).     Marland Kitchen amiodarone (PACERONE) 200 MG tablet Take 200 mg by mouth daily.    Marland Kitchen ELIQUIS 5 MG TABS tablet TAKE 1 TABLET BY MOUTH TWICE A DAY 180 tablet 1  . Propylene Glycol (SYSTANE BALANCE) 0.6 % SOLN Apply 1 drop to eye at bedtime.     No current facility-administered medications for this visit.     Allergies:   Asa arthritis strength-antacid [aspirin buffered] and Penicillins   Social History:  The patient  reports that he has never smoked. He has never used smokeless tobacco. He reports that he does not drink alcohol or use drugs.   Family History:  The patient's family history includes Arthritis in an other family member; Breast cancer in his maternal grandmother and mother; Cancer in  an other family member.   ROS:  Please see the history of present illness.   All other systems are personally reviewed and negative.    Exam:    Vital Signs:  BP (!) 121/91   Pulse (!) 131   Ht 6\' 2"  (1.88 m)   Wt 195 lb (88.5 kg)   BMI 25.04 kg/m   Well sounding, alert and conversantv  Labs/Other Tests and Data Reviewed:    Recent Labs: 05/25/2018: B Natriuretic Peptide 1,048.3; Magnesium 1.8 10/19/2018: ALT 13 11/09/2018: BUN 20; Creatinine, Ser 1.33; Hemoglobin 9.0; Platelets 234; Potassium 4.7; Sodium 137; TSH 3.420   Wt Readings from Last 3 Encounters:   02/24/19 195 lb (88.5 kg)  11/09/18 205 lb (93 kg)  10/12/18 195 lb (88.5 kg)     Echo 10/05/2018- ef has recovered from 25% to 55-60%!   ASSESSMENT & PLAN:    1.  Paroxysmal atrial fibrillation/ atrial tachycardia Doing well with amiodarone. He feels that his heart rates are improved most of the time.  He would like to consider ablation after the first of the year. He is on eliquis. He will take metoprolol 50mg  prn  2. HTN Stable No change required today  3. Cardiomyopathy Resolved with sinus   Follow-up:  3 months with me   Patient Risk:  after full review of this patients clinical status, I feel that they are at moderate risk at this time.  Today, I have spent 15 minutes with the patient with telehealth technology discussing arrhythmia management .    Army Fossa, MD  02/24/2019 8:53 AM     [facility] [address] [place] [address] [phone] (office) [phone] (fax)

## 2019-03-12 DIAGNOSIS — Z23 Encounter for immunization: Secondary | ICD-10-CM | POA: Diagnosis not present

## 2019-03-29 DIAGNOSIS — L820 Inflamed seborrheic keratosis: Secondary | ICD-10-CM | POA: Diagnosis not present

## 2019-03-29 DIAGNOSIS — L570 Actinic keratosis: Secondary | ICD-10-CM | POA: Diagnosis not present

## 2019-03-29 DIAGNOSIS — L821 Other seborrheic keratosis: Secondary | ICD-10-CM | POA: Diagnosis not present

## 2019-03-29 DIAGNOSIS — D225 Melanocytic nevi of trunk: Secondary | ICD-10-CM | POA: Diagnosis not present

## 2019-03-29 DIAGNOSIS — Z85828 Personal history of other malignant neoplasm of skin: Secondary | ICD-10-CM | POA: Diagnosis not present

## 2019-03-29 DIAGNOSIS — L814 Other melanin hyperpigmentation: Secondary | ICD-10-CM | POA: Diagnosis not present

## 2019-03-29 DIAGNOSIS — Z23 Encounter for immunization: Secondary | ICD-10-CM | POA: Diagnosis not present

## 2019-04-06 DIAGNOSIS — H18893 Other specified disorders of cornea, bilateral: Secondary | ICD-10-CM | POA: Diagnosis not present

## 2019-04-19 DIAGNOSIS — Z8546 Personal history of malignant neoplasm of prostate: Secondary | ICD-10-CM | POA: Diagnosis not present

## 2019-05-10 ENCOUNTER — Ambulatory Visit: Payer: Medicare Other | Admitting: Internal Medicine

## 2019-05-18 ENCOUNTER — Other Ambulatory Visit: Payer: Self-pay | Admitting: Cardiology

## 2019-05-22 ENCOUNTER — Ambulatory Visit: Payer: Medicare Other

## 2019-05-24 ENCOUNTER — Ambulatory Visit: Payer: Medicare Other | Attending: Internal Medicine

## 2019-05-24 DIAGNOSIS — Z23 Encounter for immunization: Secondary | ICD-10-CM | POA: Insufficient documentation

## 2019-05-24 NOTE — Progress Notes (Signed)
   Covid-19 Vaccination Clinic  Name:  Patrick [name]    MRN: [id number] DOB: 01-29-1941  05/24/2019  Patrick [name] was observed post Covid-19 immunization for 15 minutes without incidence. He was provided with Vaccine Information Sheet and instruction to access the V-Safe system.   Patrick [name] was instructed to call 911 with any severe reactions post vaccine: Marland Kitchen Difficulty breathing  . Swelling of your face and throat  . A fast heartbeat  . A bad rash all over your body  . Dizziness and weakness    Immunizations Administered    Name Date Dose VIS Date Route   Pfizer COVID-19 Vaccine 05/24/2019 12:51 PM 0.3 mL 04/09/2019 Intramuscular   Manufacturer: [employer], [employer]   Lot: [id number]   [place]: [id number]

## 2019-05-27 ENCOUNTER — Telehealth: Payer: Medicare Other | Admitting: Internal Medicine

## 2019-05-31 ENCOUNTER — Encounter: Payer: Self-pay | Admitting: Internal Medicine

## 2019-05-31 ENCOUNTER — Telehealth (INDEPENDENT_AMBULATORY_CARE_PROVIDER_SITE_OTHER): Payer: Medicare Other | Admitting: Internal Medicine

## 2019-05-31 VITALS — BP 127/80 | HR 80

## 2019-05-31 DIAGNOSIS — I10 Essential (primary) hypertension: Secondary | ICD-10-CM | POA: Diagnosis not present

## 2019-05-31 DIAGNOSIS — I471 Supraventricular tachycardia: Secondary | ICD-10-CM | POA: Diagnosis not present

## 2019-05-31 DIAGNOSIS — D6869 Other thrombophilia: Secondary | ICD-10-CM | POA: Diagnosis not present

## 2019-05-31 DIAGNOSIS — I480 Paroxysmal atrial fibrillation: Secondary | ICD-10-CM | POA: Diagnosis not present

## 2019-05-31 NOTE — Progress Notes (Signed)
Electrophysiology TeleHealth Note  Due to national recommendations of social distancing due to [place] 19, an audio telehealth visit is felt to be most appropriate for this patient at this time.  Verbal consent was obtained by me for the telehealth visit today.  The patient does not have capability for a virtual visit.  A phone visit is therefore required today.   Date:  05/31/2019   ID:  Patrick [name], DOB Feb 27, 1941, MRN [id number]  Location: patient's home  Provider location:  [place]  Evaluation Performed: Follow-up visit  PCP:  Velna Hatchet, MD   Electrophysiologist:  Dr [name]  Chief Complaint:  palpitations  History of Present Illness:    Patrick [name] is a 79 y.o. male who presents via telehealth conferencing today.  Since last being seen in our clinic, the patient reports doing very well.   He is playing golf twice per week.  He has frequent afib but is mostly unaware. Today, he denies symptoms of palpitations, chest pain, shortness of breath,  lower extremity edema, dizziness, presyncope, or syncope.  His primary concern is with bleeding with eliquis.  The patient is otherwise without complaint today.  The patient denies symptoms of fevers, chills, cough, or new SOB worrisome for COVID 19.  Past Medical History:  Diagnosis Date  . Allergy   . Atrial tachycardia ([place])   . Blood transfusion 1944  . Cataract   . Diverticulosis   . Heart murmur   . Hemorrhoids   . Hyperlipemia   . Prostate cancer [facility])    Gleason 6 status post radiation seed implant September 2010  . Rheumatic heart disease    79yrs old    Past Surgical History:  Procedure Laterality Date  . CARDIAC ELECTROPHYSIOLOGY STUDY AND ABLATION  2011   for SVT  . CARDIOVERSION N/A 05/18/2018   Procedure: CARDIOVERSION;  Surgeon: Buford Dresser, MD;  Location: [place];  Service: Cardiovascular;  Laterality: N/A;  . CATARACT EXTRACTION W/ INTRAOCULAR LENS IMPLANT  2009   right  . INGUINAL  HERNIA REPAIR  01/2006   right  . MELANOMA EXCISION  2011   superficial resection left maxillary area/ Dr [name]  . TEE WITHOUT CARDIOVERSION N/A 05/18/2018   Procedure: TRANSESOPHAGEAL ECHOCARDIOGRAM (TEE);  Surgeon: Buford Dresser, MD;  Location: [facility] ENDOSCOPY;  Service: Cardiovascular;  Laterality: N/A;  . [place]  . TOTAL KNEE ARTHROPLASTY  1/11   s/p left  . WRIST FUSION  2006   04/2004    Current Outpatient Medications  Medication Sig Dispense Refill  . acetaminophen (TYLENOL) 500 MG tablet Take 500 mg by mouth every 6 (six) hours as needed (shoulder pain).     Marland Kitchen amiodarone (PACERONE) 200 MG tablet Take 200 mg by mouth daily.    Marland Kitchen ELIQUIS 5 MG TABS tablet TAKE 1 TABLET BY MOUTH TWICE A DAY 180 tablet 1  . Propylene Glycol (SYSTANE BALANCE) 0.6 % SOLN Apply 1 drop to eye at bedtime.     No current facility-administered medications for this visit.    Allergies:   Asa arthritis strength-antacid [aspirin buffered] and Penicillins   Social History:  The patient  reports that he has never smoked. He has never used smokeless tobacco. He reports that he does not drink alcohol or use drugs.   Family History:  The patient's  family history includes Arthritis in an other family member; Breast cancer in his maternal grandmother and mother; Cancer in an other family member.  ROS:  Please see the history of present illness.   All other systems are personally reviewed and negative.    Exam:    Vital Signs:  BP 127/80   Pulse 80   Well sounding, alert and conversant   Labs/Other Tests and Data Reviewed:    Recent Labs: 10/19/2018: ALT 13 11/09/2018: BUN 20; Creatinine, Ser 1.33; Hemoglobin 9.0; Platelets 234; Potassium 4.7; Sodium 137; TSH 3.420   Wt Readings from Last 3 Encounters:  02/24/19 195 lb (88.5 kg)  11/09/18 205 lb (93 kg)  10/12/18 195 lb (88.5 kg)       ASSESSMENT & PLAN:    1.  Paroxysmal atrial fibrillation/ h/o atach Doing  very well, minimal symptoms Continue amiodarone chads2vasc score is at least 2.  He is on eliquis Given paucity of symptoms, I would not advise ablation at this time.  If symptoms become more prominent, we could revisit the possibility.  2. HTN Stable No change required today  Follow-up:  With Dr Stanford Breed in 6 months and with me in a year   Patient Risk:  after full review of this patients clinical status, I feel that they are at moderate risk at this time.  Today, I have spent 15 minutes with the patient with telehealth technology discussing arrhythmia management .    Army Fossa, MD  05/31/2019 11:27 AM     [facility] [address] [place] [address] [phone] (office) [phone] (fax)

## 2019-06-02 ENCOUNTER — Other Ambulatory Visit: Payer: Self-pay | Admitting: Adult Health

## 2019-06-02 DIAGNOSIS — I4819 Other persistent atrial fibrillation: Secondary | ICD-10-CM

## 2019-06-03 ENCOUNTER — Ambulatory Visit (INDEPENDENT_AMBULATORY_CARE_PROVIDER_SITE_OTHER): Payer: Medicare Other | Admitting: Podiatry

## 2019-06-03 ENCOUNTER — Ambulatory Visit (INDEPENDENT_AMBULATORY_CARE_PROVIDER_SITE_OTHER): Payer: Medicare Other

## 2019-06-03 ENCOUNTER — Encounter: Payer: Self-pay | Admitting: Podiatry

## 2019-06-03 ENCOUNTER — Other Ambulatory Visit: Payer: Self-pay

## 2019-06-03 VITALS — BP 164/90 | HR 63 | Temp 96.3°F | Resp 16

## 2019-06-03 DIAGNOSIS — M2042 Other hammer toe(s) (acquired), left foot: Secondary | ICD-10-CM

## 2019-06-03 DIAGNOSIS — M2041 Other hammer toe(s) (acquired), right foot: Secondary | ICD-10-CM

## 2019-06-03 NOTE — Progress Notes (Signed)
   Subjective:    Patient ID: Patrick [name], male    DOB: 03-10-1941, 79 y.o.   MRN: [id number]  HPI    Review of Systems  All other systems reviewed and are negative.      Objective:   Physical Exam        Assessment & Plan:

## 2019-06-04 NOTE — Progress Notes (Signed)
Subjective:   Patient ID: Patrick [name], male   DOB: 79 y.o.   MRN: [id number]   HPI Patient presents stating he has a very painful fifth digit right and states that he has worked on this for years trying padding therapy trying medication and other modalities with out having relief and it is getting gradually worse.  Patient is on Eliquis and has been recommended to not ever stopped taking it due to his type of A. fib he has.  Patient does not smoke and likes to be active   Review of Systems  All other systems reviewed and are negative.       Objective:  Physical Exam Vitals and nursing note reviewed.  Constitutional:      Appearance: He is well-developed.  Pulmonary:     Effort: Pulmonary effort is normal.  Musculoskeletal:        General: Normal range of motion.  Skin:    General: Skin is warm.  Neurological:     Mental Status: He is alert.     Neurovascular status intact muscle strength found to be adequate range of motion found to be within normal limits.  Patient has good digital perfusion well oriented x3 with a significant keratotic lesion medial side digit 5 right that is painful when pressed and make shoe gear difficult.  Patient states that he has tried different modalities but did not get relief and it is hard for him to wear shoe gear     Assessment:  Exostotic lesion digit 5 right with skin that is under stress secondary to treatments he is done and the pain he is experiencing     Plan:  H&P reviewed condition.  I do think the best solution is exostectomy and removal of skin wedge as this could easily at 1 point become an ulceration in the future or become worse and it hurts him quite a bit.  Patient would like to undergo this and understands the procedure and is willing to accept the risk of surgery.  Patient at this point was given consent form reviewing alternative treatments complications and patient signed consent form understanding all risk..  Patient is  scheduled for outpatient surgery at the office and was given everything associated with that and is scheduled for the next several weeks and is encouraged to call with questions concerns  X-ray indicates significant rotation digit 5 right pressing against digit 4 right

## 2019-06-07 NOTE — Progress Notes (Signed)
HPI: FU CM and atrial fibrillation. Patient has had previous ablation of atrial tachycardia in 2011. Patient seen December 2019 with complaints of palpitations. He was found to be in atrial tachycardia. Patient was placed on beta-blocker. He was felt to be at increased risk for repeat ablationgivenhistory of rheumatic fever. Echo was ordered and showed newly reduced LV function with ejection fraction 20 to 25%, mild left ventricular hypertrophy, mild mitral regurgitation. His heart rate during the study was 120-160.Had TEE on May 18, 2018. Ejection fraction 20 to 25%, mild mitral regurgitation, no left atrial appendage thrombus. Patient successfully cardioverted. Note Dr. [name] had planned to admit afterwards and begin Tikosyn but patient declined as his wife had a cardiac arrest on Tikosyn.Found to be in atrial fibrillation at follow-up office visit. Amiodarone initiated and patient converted to sinus rhythm. Echocardiogram repeated June 2020 and showed normal LV function. There was mild left atrial enlargement and trace aortic insufficiency. Since last seen the patient denies any dyspnea on exertion, orthopnea, PND, pedal edema, palpitations, syncope or chest pain.  Patient does state that his heart rate will be elevated to as high as 130 at times.  No associated symptoms.  Current Outpatient Medications  Medication Sig Dispense Refill  . acetaminophen (TYLENOL) 500 MG tablet Take 500 mg by mouth every 6 (six) hours as needed (shoulder pain).     Marland Kitchen amiodarone (PACERONE) 200 MG tablet Take 1 tablet (200 mg total) by mouth daily. 30 tablet 6  . ELIQUIS 5 MG TABS tablet TAKE 1 TABLET BY MOUTH TWICE A DAY 180 tablet 1  . Propylene Glycol (SYSTANE BALANCE) 0.6 % SOLN Apply 1 drop to eye at bedtime.     No current facility-administered medications for this visit.     Past Medical History:  Diagnosis Date  . Allergy   . Atrial tachycardia ([place])   . Blood transfusion 1944  .  Cataract   . Diverticulosis   . Heart murmur   . Hemorrhoids   . Hyperlipemia   . Prostate cancer [facility])    Gleason 6 status post radiation seed implant September 2010  . Rheumatic heart disease    79yrs old    Past Surgical History:  Procedure Laterality Date  . CARDIAC ELECTROPHYSIOLOGY STUDY AND ABLATION  2011   for SVT  . CARDIOVERSION N/A 05/18/2018   Procedure: CARDIOVERSION;  Surgeon: Buford Dresser, MD;  Location: [place];  Service: Cardiovascular;  Laterality: N/A;  . CATARACT EXTRACTION W/ INTRAOCULAR LENS IMPLANT  2009   right  . INGUINAL HERNIA REPAIR  01/2006   right  . MELANOMA EXCISION  2011   superficial resection left maxillary area/ Dr [name]  . TEE WITHOUT CARDIOVERSION N/A 05/18/2018   Procedure: TRANSESOPHAGEAL ECHOCARDIOGRAM (TEE);  Surgeon: Buford Dresser, MD;  Location[facility] ENDOSCOPY;  Service: Cardiovascular;  Laterality: N/A;  . [place]  . TOTAL KNEE ARTHROPLASTY  1/11   s/p left  . WRIST FUSION  2006   04/2004    Social History   Socioeconomic History  . Marital status: Widowed    Spouse name: Not on file  . Number of children: Not on file  . Years of education: Not on file  . Highest education level: Not on file  Occupational History  . Occupation: Retired    Fish farm manager: [employer]  Tobacco Use  . Smoking status: Never Smoker  . Smokeless tobacco: Never Used  Substance and Sexual Activity  . Alcohol use: No  .  Drug use: No  . Sexual activity: Not Currently  Other Topics Concern  . Not on file  Social History Narrative   Regular exercise-Yes   Married   Lives in [place]   Social Determinants of Health   Financial Resource Strain:   . Difficulty of Paying Living Expenses: Not on file  Food Insecurity:   . Worried About Charity fundraiser in the Last Year: Not on file  . Ran Out of Food in the Last Year: Not on file  Transportation Needs:   . Lack of Transportation  (Medical): Not on file  . Lack of Transportation (Non-Medical): Not on file  Physical Activity:   . Days of Exercise per Week: Not on file  . Minutes of Exercise per Session: Not on file  Stress:   . Feeling of Stress : Not on file  Social Connections:   . Frequency of Communication with Friends and Family: Not on file  . Frequency of Social Gatherings with Friends and Family: Not on file  . Attends Religious Services: Not on file  . Active Member of Clubs or Organizations: Not on file  . Attends Archivist Meetings: Not on file  . Marital Status: Not on file  Intimate Partner Violence:   . Fear of Current or Ex-Partner: Not on file  . Emotionally Abused: Not on file  . Physically Abused: Not on file  . Sexually Abused: Not on file    Family History  Problem Relation Age of Onset  . Arthritis Other   . Cancer Other        Breast cancer 1st degree relative<50  . Breast cancer Mother   . Breast cancer Maternal Grandmother   . Colon cancer Neg Hx   . Esophageal cancer Neg Hx   . Pancreatic cancer Neg Hx   . Rectal cancer Neg Hx   . Stomach cancer Neg Hx     ROS: no fevers or chills, productive cough, hemoptysis, dysphasia, odynophagia, melena, hematochezia, dysuria, hematuria, rash, seizure activity, orthopnea, PND, pedal edema, claudication. Remaining systems are negative.  Physical Exam: Well-developed well-nourished in no acute distress.  Skin is warm and dry.  HEENT is normal.  Neck is supple.  Chest is clear to auscultation with normal expansion.  Cardiovascular exam is regular rate and rhythm.  Abdominal exam nontender or distended. No masses palpated. Extremities show no edema. neuro grossly intact  ECG-sinus rhythm at a rate of 82, occasional PAC, first-degree AV block, left ventricular hypertrophy, no ST changes.  Personally reviewed  A/P  1 paroxysmal atrial fibrillation-patient is in sinus rhythm today.  Continue amiodarone at present dose. Check  TSH, liver functions and chest x-ray. Continue apixaban. Check hemoglobin and renal function. Patient is followed by Dr. [name] and ablation can be considered in the future if he has frequent recurrences.  Note his heart rate is elevated at times but he does not have symptoms.  We discussed alivecor today to assess rhythm at times of tachycardia.  2 cardiomyopathy-previous reduction in LV function felt secondary to tachycardia.  Improved on most recent echocardiogram.  3 history of atrial tachycardia-status post ablation.  4 hypertension-blood pressure controlled on no meds.  Kirk Ruths, MD

## 2019-06-08 DIAGNOSIS — L820 Inflamed seborrheic keratosis: Secondary | ICD-10-CM | POA: Diagnosis not present

## 2019-06-08 DIAGNOSIS — L570 Actinic keratosis: Secondary | ICD-10-CM | POA: Diagnosis not present

## 2019-06-08 DIAGNOSIS — Z23 Encounter for immunization: Secondary | ICD-10-CM | POA: Diagnosis not present

## 2019-06-09 ENCOUNTER — Other Ambulatory Visit: Payer: Self-pay | Admitting: Cardiology

## 2019-06-10 NOTE — Telephone Encounter (Signed)
Rx has been sent to the pharmacy electronically. ° °

## 2019-06-14 ENCOUNTER — Ambulatory Visit: Payer: Medicare Other | Attending: Internal Medicine

## 2019-06-14 DIAGNOSIS — Z23 Encounter for immunization: Secondary | ICD-10-CM | POA: Insufficient documentation

## 2019-06-14 NOTE — Progress Notes (Signed)
   Covid-19 Vaccination Clinic  Name:  Patrick [name]    MRN: [id number] DOB: 1940/05/02  06/14/2019  Mr. Krocker was observed post Covid-19 immunization for 15 minutes without incidence. He was provided with Vaccine Information Sheet and instruction to access the V-Safe system.   Mr. Alpern was instructed to call 911 with any severe reactions post vaccine: Marland Kitchen Difficulty breathing  . Swelling of your face and throat  . A fast heartbeat  . A bad rash all over your body  . Dizziness and weakness    Immunizations Administered    Name Date Dose VIS Date Route   Pfizer COVID-19 Vaccine 06/14/2019 11:54 AM 0.3 mL 04/09/2019 Intramuscular   Manufacturer: [place]   Lot: [id number]   [place]: [id number]

## 2019-06-16 ENCOUNTER — Ambulatory Visit
Admission: RE | Admit: 2019-06-16 | Discharge: 2019-06-16 | Disposition: A | Discharge status: Home / Self Care | Payer: Medicare Other | Source: Ambulatory Visit | Attending: Cardiology | Admitting: Cardiology

## 2019-06-16 ENCOUNTER — Ambulatory Visit (INDEPENDENT_AMBULATORY_CARE_PROVIDER_SITE_OTHER): Payer: Medicare Other | Admitting: Cardiology

## 2019-06-16 ENCOUNTER — Other Ambulatory Visit: Payer: Self-pay

## 2019-06-16 ENCOUNTER — Encounter: Payer: Self-pay | Admitting: *Deleted

## 2019-06-16 ENCOUNTER — Encounter: Payer: Self-pay | Admitting: Cardiology

## 2019-06-16 ENCOUNTER — Telehealth: Payer: Self-pay | Admitting: *Deleted

## 2019-06-16 VITALS — BP 132/78 | HR 82 | Ht 74.0 in | Wt 207.6 lb

## 2019-06-16 DIAGNOSIS — I428 Other cardiomyopathies: Secondary | ICD-10-CM | POA: Diagnosis not present

## 2019-06-16 DIAGNOSIS — Z79899 Other long term (current) drug therapy: Secondary | ICD-10-CM

## 2019-06-16 DIAGNOSIS — I480 Paroxysmal atrial fibrillation: Secondary | ICD-10-CM

## 2019-06-16 DIAGNOSIS — Z5181 Encounter for therapeutic drug level monitoring: Secondary | ICD-10-CM | POA: Diagnosis not present

## 2019-06-16 DIAGNOSIS — I10 Essential (primary) hypertension: Secondary | ICD-10-CM

## 2019-06-16 LAB — CBC
Hematocrit: 42.6 % (ref 37.5–51.0)
Hemoglobin: 14.8 g/dL (ref 13.0–17.7)
MCH: 34.4 pg — ABNORMAL HIGH (ref 26.6–33.0)
MCHC: 34.7 g/dL (ref 31.5–35.7)
MCV: 99 fL — ABNORMAL HIGH (ref 79–97)
Platelets: 191 10*3/uL (ref 150–450)
RBC: 4.3 x10E6/uL (ref 4.14–5.80)
RDW: 12.6 % (ref 11.6–15.4)
WBC: 4.7 10*3/uL (ref 3.4–10.8)

## 2019-06-16 LAB — COMPREHENSIVE METABOLIC PANEL
ALT: 14 IU/L (ref 0–44)
AST: 23 IU/L (ref 0–40)
Albumin/Globulin Ratio: 1.4 (ref 1.2–2.2)
Albumin: 4.4 g/dL (ref 3.7–4.7)
Alkaline Phosphatase: 80 IU/L (ref 39–117)
BUN/Creatinine Ratio: 10 (ref 10–24)
BUN: 14 mg/dL (ref 8–27)
Bilirubin Total: 0.9 mg/dL (ref 0.0–1.2)
CO2: 24 mmol/L (ref 20–29)
Calcium: 9.4 mg/dL (ref 8.6–10.2)
Chloride: 100 mmol/L (ref 96–106)
Creatinine, Ser: 1.41 mg/dL — ABNORMAL HIGH (ref 0.76–1.27)
GFR calc Af Amer: 55 mL/min/{1.73_m2} — ABNORMAL LOW (ref >59)
GFR calc non Af Amer: 48 mL/min/{1.73_m2} — ABNORMAL LOW (ref >59)
Globulin, Total: 3.2 g/dL (ref 1.5–4.5)
Glucose: 104 mg/dL — ABNORMAL HIGH (ref 65–99)
Potassium: 4.4 mmol/L (ref 3.5–5.2)
Sodium: 138 mmol/L (ref 134–144)
Total Protein: 7.6 g/dL (ref 6.0–8.5)

## 2019-06-16 LAB — TSH: TSH: 3.8 u[IU]/mL (ref 0.450–4.500)

## 2019-06-16 NOTE — Patient Instructions (Signed)
Medication Instructions:  NO CHANGE *If you need a refill on your cardiac medications before your next appointment, please call your pharmacy*  Lab Work: Your physician recommends that you HAVE LAB WORK TODAY If you have labs (blood work) drawn today and your tests are completely normal, you will receive your results only by: Marland Kitchen MyChart Message (if you have MyChart) OR . A paper copy in the mail If you have any lab test that is abnormal or we need to change your treatment, we will call you to review the results.  Testing/Procedures: A chest x-ray takes a picture of the organs and structures inside the chest, including the heart, lungs, and blood vessels. This test can show several things, including, whether the heart is enlarges; whether fluid is building up in the lungs; and whether pacemaker / defibrillator leads are still in place. [place]: At [facility], you and your health needs are our priority.  As part of our continuing mission to provide you with exceptional heart care, we have created designated Provider Care Teams.  These Care Teams include your primary Cardiologist (physician) and Advanced Practice Providers (APPs -  Physician Assistants and Nurse Practitioners) who all work together to provide you with the care you need, when you need it.  Your next appointment:   6 month(s)  The format for your next appointment:   Either In Person or Virtual  Provider:   You may see Kirk Ruths, MD or one of the following Advanced Practice Providers on your designated Care Team:    Kerin Ransom, PA-C  [place], [place]  Coletta Memos, [place]

## 2019-06-16 NOTE — Telephone Encounter (Addendum)
Message sent to patient via my chart  ----- Message from Lelon Perla, MD sent at 06/16/2019  3:48 PM EST ----- No amiodarone toxicity Kirk Ruths

## 2019-06-30 ENCOUNTER — Other Ambulatory Visit: Payer: Self-pay

## 2019-06-30 ENCOUNTER — Encounter: Payer: Self-pay | Admitting: Podiatry

## 2019-06-30 ENCOUNTER — Ambulatory Visit (INDEPENDENT_AMBULATORY_CARE_PROVIDER_SITE_OTHER): Payer: Medicare Other | Admitting: Podiatry

## 2019-06-30 VITALS — BP 159/88 | HR 68 | Temp 97.3°F | Resp 16

## 2019-06-30 DIAGNOSIS — D169 Benign neoplasm of bone and articular cartilage, unspecified: Secondary | ICD-10-CM | POA: Diagnosis not present

## 2019-06-30 NOTE — Progress Notes (Signed)
Subjective:   Patient ID: Patrick [name], male   DOB: 79 y.o.   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  Patient wants this done and I went ahead and I anesthetized the toe 60 mg like Marcaine mixture sterile prep done and I went ahead and inflated tourniquet to 150 mmHg and then did sterile prep of the area and I did a wide excision of the keratotic exostotic lesion and then utilizing a side cutting bone bur and a bone down the bone dual paced and I flushed out and then sutured the tissue with 5-0 nylon.  I applied sterile dressing I instructed on elevation dispensed surgical shoe and reappoint to recheck in 2 weeks or earlier if needed

## 2019-07-08 DIAGNOSIS — M542 Cervicalgia: Secondary | ICD-10-CM | POA: Diagnosis not present

## 2019-07-14 ENCOUNTER — Other Ambulatory Visit: Payer: Self-pay

## 2019-07-14 ENCOUNTER — Ambulatory Visit (INDEPENDENT_AMBULATORY_CARE_PROVIDER_SITE_OTHER): Payer: Medicare Other

## 2019-07-14 ENCOUNTER — Ambulatory Visit (INDEPENDENT_AMBULATORY_CARE_PROVIDER_SITE_OTHER): Payer: Medicare Other | Admitting: Podiatry

## 2019-07-14 DIAGNOSIS — M2041 Other hammer toe(s) (acquired), right foot: Secondary | ICD-10-CM

## 2019-07-14 DIAGNOSIS — M2042 Other hammer toe(s) (acquired), left foot: Secondary | ICD-10-CM

## 2019-07-14 DIAGNOSIS — Z9889 Other specified postprocedural states: Secondary | ICD-10-CM

## 2019-07-15 ENCOUNTER — Encounter: Payer: Self-pay | Admitting: Podiatry

## 2019-07-15 NOTE — Progress Notes (Signed)
Subjective:  Patient ID: Patrick [name], male    DOB: [birth year],  MRN: [id number]  Chief Complaint  Patient presents with  . Routine Post Op    POV#1 DOS 03.03.2021 Exostectomy 5th rt. Pt states no concerns, denies fever/chills/nausea/vomiting.    79 y.o. male returns for post-op check.  Patient has been wearing surgical shoe.  Patient presented with right fifth digit exostectomy with sutures that are intact.  Patient has been getting the incision site wet and therefore there are some maceration present.  He denies any other signs symptoms of infection.  He denies any other acute complaints.  Review of Systems: Negative except as noted in the HPI. Denies N/V/F/Ch.  Past Medical History:  Diagnosis Date  . Allergy   . Atrial tachycardia ([place])   . Blood transfusion 1944  . Cataract   . Diverticulosis   . Heart murmur   . Hemorrhoids   . Hyperlipemia   . Prostate cancer [facility])    Gleason 6 status post radiation seed implant September 2010  . Rheumatic heart disease    79yrs old    Current Outpatient Medications:  .  acetaminophen (TYLENOL) 500 MG tablet, Take 500 mg by mouth every 6 (six) hours as needed (shoulder pain). , Disp: , Rfl:  .  amiodarone (PACERONE) 200 MG tablet, Take 1 tablet (200 mg total) by mouth daily., Disp: 30 tablet, Rfl: 6 .  azithromycin (ZITHROMAX) 250 MG tablet, azithromycin 250 mg tablet  TAKE 2 TABLETS BY MOUTH TODAY, THEN TAKE 1 TABLET DAILY FOR 4 DAYS, Disp: , Rfl:  .  clindamycin (CLEOCIN) 150 MG capsule, clindamycin HCl 150 mg capsule  TAKE 1 CAPSULE BY MOUTH FOUR TIMES A DAY UNTIL FINISHED, Disp: , Rfl:  .  ELIQUIS 5 MG TABS tablet, TAKE 1 TABLET BY MOUTH TWICE A DAY, Disp: 180 tablet, Rfl: 1 .  HYDROcodone-acetaminophen (NORCO/VICODIN) 5-325 MG tablet, hydrocodone 5 mg-acetaminophen 325 mg tablet  TAKE 1 TABLET BY MOUTH EVERY 4 TO 6 HOURS AS NEEDED FOR PAIN, Disp: , Rfl:  .  losartan (COZAAR) 25 MG tablet, Take 25 mg by mouth daily., Disp: , Rfl:  .   predniSONE (DELTASONE) 10 MG tablet, prednisone 10 mg tablet  take 1 tab three times a day for 2 days, 1 tab twice a day for 5 days, 1 tab daily till finished, Disp: , Rfl:  .  Propylene Glycol (SYSTANE BALANCE) 0.6 % SOLN, Apply 1 drop to eye at bedtime., Disp: , Rfl:   Social History   Tobacco Use  Smoking Status Never Smoker  Smokeless Tobacco Never Used    Allergies  Allergen Reactions  . Asa Arthritis Strength-Antacid [Aspirin Buffered] Swelling    angioedema  . Penicillins Rash    DID THE REACTION INVOLVE: Swelling of the face/tongue/throat, SOB, or low BP? No Sudden or severe rash/hives, skin peeling, or the inside of the mouth or nose? No Did it require medical treatment? No When did it last happen?50+ years ago If all above answers are "NO", may proceed with cephalosporin use.    Objective:  There were no vitals filed for this visit. There is no height or weight on file to calculate BMI. Constitutional Well developed. Well nourished.  Vascular Foot warm and well perfused. Capillary refill normal to all digits.   Neurologic Normal speech. Oriented to person, place, and time. Epicritic sensation to light touch grossly present bilaterally.  Dermatologic Skin healing well without signs of infection. Skin edges well coapted without signs of  infection.  Orthopedic: Tenderness to palpation noted about the surgical site.   Radiographs: 3 views of skeletally mature adult right foot: Good correction and alignment noted.  Severe arthritic changes of the first metatarsal some phalangeal joint noted.  Plantar heel spurring noted.  No other bony abnormalities identified.  Assessment:   1. Hammer toes of both feet   2. Status post foot surgery    Plan:  Patient was evaluated and treated and all questions answered.  S/p foot surgery right -Progressing as expected post-operatively. -XR: See above -WB Status: Weightbearing as tolerated in surgical shoe -Sutures: Given  that there was maceration present.  The incision was not quite well coapted.  I made the decision to leave the stitches in.  We will plan on removing during next visit. -Medications: None -I have asked the patient to Betadine wet-to-dry dressing changes given that there was maceration present in and in the interdigital space.  Patient states understanding and will do so.  I have asked him to do 3 times a week. -He will be scheduled to see Dr. Paulla Dolly again in 2 weeks.  Return for POV#2 Stich removal.

## 2019-07-22 ENCOUNTER — Other Ambulatory Visit: Payer: Self-pay

## 2019-07-22 ENCOUNTER — Encounter: Payer: Self-pay | Admitting: Podiatry

## 2019-07-22 ENCOUNTER — Other Ambulatory Visit: Payer: Self-pay | Admitting: Podiatry

## 2019-07-22 ENCOUNTER — Ambulatory Visit (INDEPENDENT_AMBULATORY_CARE_PROVIDER_SITE_OTHER): Payer: Medicare Other

## 2019-07-22 ENCOUNTER — Ambulatory Visit (INDEPENDENT_AMBULATORY_CARE_PROVIDER_SITE_OTHER): Payer: Medicare Other | Admitting: Podiatry

## 2019-07-22 VITALS — Temp 97.9°F

## 2019-07-22 DIAGNOSIS — M2041 Other hammer toe(s) (acquired), right foot: Secondary | ICD-10-CM | POA: Diagnosis not present

## 2019-07-22 DIAGNOSIS — M2042 Other hammer toe(s) (acquired), left foot: Secondary | ICD-10-CM

## 2019-07-22 NOTE — Progress Notes (Signed)
Subjective:   Patient ID: Patrick [name], male   DOB: 79 y.o.   MRN: [id number]   HPI Patient states I am doing fine and I know that toes a little bit macerated and I been getting it wet   ROS      Objective:  Physical Exam  Neurovascular status intact negative Bevelyn Buckles' sign noted incision site right fifth digit is slightly macerated but it is healing okay with no redness erythema drainage or odor noted     Assessment:  Doing well post exostectomy digit 5 right with slight maceration     Plan:  Stitches removed applied a small amount of Iodosorb to the toe with sterile dressing and advised on soaks as needed and try to keep the toe separated.  Patient will be seen back to recheck as needed and was given encouragement to let us know if any issues were to occur with healing

## 2019-08-13 DIAGNOSIS — M25521 Pain in right elbow: Secondary | ICD-10-CM | POA: Diagnosis not present

## 2019-08-18 DIAGNOSIS — L570 Actinic keratosis: Secondary | ICD-10-CM | POA: Diagnosis not present

## 2019-08-18 DIAGNOSIS — L820 Inflamed seborrheic keratosis: Secondary | ICD-10-CM | POA: Diagnosis not present

## 2019-08-20 DIAGNOSIS — M25521 Pain in right elbow: Secondary | ICD-10-CM | POA: Diagnosis not present

## 2019-09-09 ENCOUNTER — Other Ambulatory Visit: Payer: Self-pay | Admitting: Cardiology

## 2019-09-09 ENCOUNTER — Other Ambulatory Visit: Payer: Self-pay

## 2019-09-09 DIAGNOSIS — I428 Other cardiomyopathies: Secondary | ICD-10-CM

## 2019-09-09 MED ORDER — AMIODARONE HCL 200 MG PO TABS: 200.0000 mg | ORAL_TABLET | Freq: Every day | ORAL | 3 refills | Status: DC

## 2019-11-09 ENCOUNTER — Telehealth: Payer: Self-pay | Admitting: *Deleted

## 2019-11-09 NOTE — Telephone Encounter (Signed)
° °  [facility] Pre-operative Risk Assessment    Primary cardiolgy  Request for surgical clearance:  1. What type of surgery is being performed? EPIDURAL STEROID  INJECTION  FLUOROSCOPIC GUIDED (PROC)  2. When is this surgery scheduled? TBD  3. What type of clearance is required (medical clearance vs. Pharmacy clearance to hold med vs. Both)? BOTH  4. Are there any medications that need to be held prior to surgery and how long? ELIQUIS  3 DAYS PRIOR   5. Practice name and name of physician performing surgery?  [facility]  EDMISTEN,PA-C  6. What is the office phone number? [phone]   7.   What is the office fax number? [place]   Anesthesia type (None, local, MAC, general) ? Sallee Provencal 11/09/2019, 12:38 PM  _________________________________________________________________   (provider comments below)

## 2019-11-09 NOTE — Telephone Encounter (Signed)
Patient with diagnosis of afib on Eliquis for anticoagulation.    Procedure: EPIDURAL STEROID  INJECTION  FLUOROSCOPIC GUIDED (PROC) Date of procedure: TBD  CHADS2-VASc score of  3 (HTN, AGE, AGE)  CrCl 50 ml/min  Per office protocol, patient can hold Eliquis for 3 days prior to procedure.

## 2019-11-10 NOTE — Telephone Encounter (Signed)
   Primary Cardiologist: Kirk Ruths, MD  Chart reviewed and patient contacted by phone today as part of pre-operative protocol coverage. Given past medical history and time since last visit, based on ACC/AHA guidelines, Patrick [name] would be at acceptable risk for the planned procedure without further cardiovascular testing.   Ok to hold Eliquis 3 days pre op- resume as soon as safe post op.  I will route this recommendation to the requesting party via Epic fax function and remove from pre-op pool.  Please call with questions.  Kerin Ransom, PA-C 11/10/2019, 1:49 PM

## 2019-11-10 NOTE — Telephone Encounter (Signed)
Follow up   Pt is returning call    

## 2019-11-10 NOTE — Telephone Encounter (Signed)
Called and left message to call back.  Kerin Ransom PA-C 11/10/2019 8:51 AM

## 2019-11-16 DIAGNOSIS — M5136 Other intervertebral disc degeneration, lumbar region: Secondary | ICD-10-CM | POA: Diagnosis not present

## 2019-11-18 DIAGNOSIS — M5416 Radiculopathy, lumbar region: Secondary | ICD-10-CM | POA: Diagnosis not present

## 2019-11-18 DIAGNOSIS — L570 Actinic keratosis: Secondary | ICD-10-CM | POA: Diagnosis not present

## 2019-11-18 DIAGNOSIS — M25551 Pain in right hip: Secondary | ICD-10-CM | POA: Diagnosis not present

## 2019-11-18 DIAGNOSIS — L820 Inflamed seborrheic keratosis: Secondary | ICD-10-CM | POA: Diagnosis not present

## 2019-11-25 DIAGNOSIS — M25551 Pain in right hip: Secondary | ICD-10-CM | POA: Diagnosis not present

## 2019-11-25 DIAGNOSIS — M5416 Radiculopathy, lumbar region: Secondary | ICD-10-CM | POA: Diagnosis not present

## 2019-12-01 ENCOUNTER — Other Ambulatory Visit: Payer: Self-pay | Admitting: Pharmacist

## 2019-12-01 DIAGNOSIS — I4819 Other persistent atrial fibrillation: Secondary | ICD-10-CM

## 2019-12-01 MED ORDER — APIXABAN 5 MG PO TABS: 5.0000 mg | ORAL_TABLET | Freq: Two times a day (BID) | ORAL | 1 refills | Status: DC

## 2019-12-01 NOTE — Telephone Encounter (Signed)
Dx A.Fib 79yo Male Scr 1.41 on feb/2021 Last OV 05/2019 - rd [place] Wt 94.2kg

## 2019-12-06 DIAGNOSIS — M25551 Pain in right hip: Secondary | ICD-10-CM | POA: Diagnosis not present

## 2019-12-06 DIAGNOSIS — M5416 Radiculopathy, lumbar region: Secondary | ICD-10-CM | POA: Diagnosis not present

## 2019-12-06 NOTE — Progress Notes (Signed)
HPI: FU CM and atrial fibrillation. Patient has had previous ablation of atrial tachycardia in 2011. Patient seen December 2019 with complaints of palpitations. He was found to be in atrial tachycardia. Patient was placed on beta-blocker. He was felt to be at increased risk for repeat ablationgivenhistory of rheumatic fever. Echo was ordered and showed newly reduced LV function with ejection fraction 20 to 25%, mild left ventricular hypertrophy, mild mitral regurgitation. His heart rate during the study was 120-160.Had TEE on May 18, 2018. Ejection fraction 20 to 25%, mild mitral regurgitation, no left atrial appendage thrombus. Patient successfully cardioverted. Note Dr. [name] had planned to admit afterwards and begin Tikosyn but patient declined as his wife had a cardiac arrest on Tikosyn.Found to be in atrial fibrillation at follow-up office visit. Amiodarone initiatedand patient converted to sinus rhythm. Echocardiogram repeated June 2020 and showed normal LV function. There was mild left atrial enlargement and trace aortic insufficiency. Since last seen the patient denies any dyspnea on exertion, orthopnea, PND, pedal edema, palpitations, syncope or chest pain.  No bleeding.   Current Outpatient Medications  Medication Sig Dispense Refill   acetaminophen (TYLENOL) 500 MG tablet Take 500 mg by mouth every 6 (six) hours as needed (shoulder pain).      amiodarone (PACERONE) 200 MG tablet Take 1 tablet (200 mg total) by mouth daily. 90 tablet 3   apixaban (ELIQUIS) 5 MG TABS tablet Take 1 tablet (5 mg total) by mouth 2 (two) times daily. 180 tablet 1   losartan (COZAAR) 25 MG tablet Take 25 mg by mouth daily.     Propylene Glycol (SYSTANE BALANCE) 0.6 % SOLN Apply 1 drop to eye at bedtime.     No current facility-administered medications for this visit.     Past Medical History:  Diagnosis Date   Allergy    Atrial tachycardia ([place])    Blood transfusion 1944    Cataract    Diverticulosis    Heart murmur    Hemorrhoids    Hyperlipemia    Prostate cancer ([place])    Gleason 6 status post radiation seed implant September 2010   Rheumatic heart disease    79yrs old    Past Surgical History:  Procedure Laterality Date   CARDIAC ELECTROPHYSIOLOGY STUDY AND ABLATION  2011   for SVT   CARDIOVERSION N/A 05/18/2018   Procedure: CARDIOVERSION;  Surgeon: Buford Dresser, MD;  Location: [facility] ENDOSCOPY;  Service: Cardiovascular;  Laterality: N/A;   CATARACT EXTRACTION W/ INTRAOCULAR LENS IMPLANT  2009   right   INGUINAL HERNIA REPAIR  01/2006   right   MELANOMA EXCISION  2011   superficial resection left maxillary area/ Dr [name]   TEE WITHOUT CARDIOVERSION N/A 05/18/2018   Procedure: TRANSESOPHAGEAL ECHOCARDIOGRAM (TEE);  Surgeon: Buford Dresser, MD;  Location: [facility] ENDOSCOPY;  Service: Cardiovascular;  Laterality: N/A;   [place]  1/11   s/p left   WRIST FUSION  2006   04/2004    Social History   Socioeconomic History   Marital status: Widowed    Spouse name: Not on file   Number of children: Not on file   Years of education: Not on file   Highest education level: Not on file  Occupational History   Occupation: Retired    Fish farm manager: [employer]  Tobacco Use   Smoking status: Never Smoker   Smokeless tobacco: Never Used  Substance and Sexual Activity   Alcohol use:  No   Drug use: No   Sexual activity: Not Currently  Other Topics Concern   Not on file  Social History Narrative   Regular exercise-Yes   Married   Lives in [place]   Social Determinants of Health   Financial Resource Strain:    Difficulty of Paying Living Expenses: Not on file  Food Insecurity:    Worried About Charity fundraiser in the Last Year: Not on file   [employer] of Food in the Last Year: Not on file  Transportation Needs:    Lack of Transportation  (Medical): Not on file   Lack of Transportation (Non-Medical): Not on file  Physical Activity:    Days of Exercise per Week: Not on file   Minutes of Exercise per Session: Not on file  Stress:    Feeling of Stress : Not on file  Social Connections:    Frequency of Communication with Friends and Family: Not on file   Frequency of Social Gatherings with Friends and Family: Not on file   Attends Religious Services: Not on file   Active Member of Clubs or Organizations: Not on file   Attends Archivist Meetings: Not on file   Marital Status: Not on file  Intimate Partner Violence:    Fear of Current or Ex-Partner: Not on file   Emotionally Abused: Not on file   Physically Abused: Not on file   Sexually Abused: Not on file    Family History  Problem Relation Age of Onset   Arthritis Other    Cancer Other        Breast cancer 1st degree relative<50   Breast cancer Mother    Breast cancer Maternal Grandmother    Colon cancer Neg Hx    Esophageal cancer Neg Hx    Pancreatic cancer Neg Hx    Rectal cancer Neg Hx    Stomach cancer Neg Hx     ROS: no fevers or chills, productive cough, hemoptysis, dysphasia, odynophagia, melena, hematochezia, dysuria, hematuria, rash, seizure activity, orthopnea, PND, pedal edema, claudication. Remaining systems are negative.  Physical Exam: Well-developed well-nourished in no acute distress.  Skin is warm and dry.  HEENT is normal.  Neck is supple.  Chest is clear to auscultation with normal expansion.  Cardiovascular exam is regular rate and rhythm.  Abdominal exam nontender or distended. No masses palpated. Extremities show no edema. neuro grossly intact   A/P  1 paroxysmal atrial fibrillation-patient remains in sinus rhythm.  Continue amiodarone.  Check TSH, liver functions and chest x-ray.  Continue apixaban.  Check hemoglobin and renal function.  Referral for ablation can be considered in the future if he  has more frequent episodes.  2 hypertension-blood pressure controlled on no medications.  3 history of cardiomyopathy-LV function improved on most recent echocardiogram.  Previous reduction felt secondary to tachycardia.  4 history of atrial tachycardia status post ablation  Kirk Ruths, MD

## 2019-12-07 DIAGNOSIS — D6869 Other thrombophilia: Secondary | ICD-10-CM | POA: Diagnosis not present

## 2019-12-07 DIAGNOSIS — I4891 Unspecified atrial fibrillation: Secondary | ICD-10-CM | POA: Diagnosis not present

## 2019-12-09 DIAGNOSIS — M5416 Radiculopathy, lumbar region: Secondary | ICD-10-CM | POA: Diagnosis not present

## 2019-12-09 DIAGNOSIS — M25551 Pain in right hip: Secondary | ICD-10-CM | POA: Diagnosis not present

## 2019-12-14 DIAGNOSIS — M25551 Pain in right hip: Secondary | ICD-10-CM | POA: Diagnosis not present

## 2019-12-14 DIAGNOSIS — M5416 Radiculopathy, lumbar region: Secondary | ICD-10-CM | POA: Diagnosis not present

## 2019-12-16 DIAGNOSIS — M5416 Radiculopathy, lumbar region: Secondary | ICD-10-CM | POA: Diagnosis not present

## 2019-12-16 DIAGNOSIS — M25551 Pain in right hip: Secondary | ICD-10-CM | POA: Diagnosis not present

## 2019-12-17 ENCOUNTER — Encounter: Payer: Self-pay | Admitting: Cardiology

## 2019-12-17 ENCOUNTER — Ambulatory Visit
Admission: RE | Admit: 2019-12-17 | Discharge: 2019-12-17 | Disposition: A | Discharge status: Home / Self Care | Payer: Medicare Other | Source: Ambulatory Visit | Attending: Cardiology | Admitting: Cardiology

## 2019-12-17 ENCOUNTER — Other Ambulatory Visit: Payer: Self-pay

## 2019-12-17 ENCOUNTER — Ambulatory Visit (INDEPENDENT_AMBULATORY_CARE_PROVIDER_SITE_OTHER): Payer: Medicare Other | Admitting: Cardiology

## 2019-12-17 VITALS — BP 130/68 | HR 68 | Ht 74.0 in | Wt 203.2 lb

## 2019-12-17 DIAGNOSIS — I480 Paroxysmal atrial fibrillation: Secondary | ICD-10-CM | POA: Diagnosis not present

## 2019-12-17 DIAGNOSIS — I10 Essential (primary) hypertension: Secondary | ICD-10-CM | POA: Diagnosis not present

## 2019-12-17 DIAGNOSIS — Z5181 Encounter for therapeutic drug level monitoring: Secondary | ICD-10-CM | POA: Diagnosis not present

## 2019-12-17 DIAGNOSIS — I428 Other cardiomyopathies: Secondary | ICD-10-CM | POA: Diagnosis not present

## 2019-12-17 DIAGNOSIS — Z79899 Other long term (current) drug therapy: Secondary | ICD-10-CM | POA: Diagnosis not present

## 2019-12-17 DIAGNOSIS — M419 Scoliosis, unspecified: Secondary | ICD-10-CM | POA: Diagnosis not present

## 2019-12-17 LAB — COMPREHENSIVE METABOLIC PANEL
ALT: 15 IU/L (ref 0–44)
AST: 26 IU/L (ref 0–40)
Albumin/Globulin Ratio: 1.6 (ref 1.2–2.2)
Albumin: 4.5 g/dL (ref 3.7–4.7)
Alkaline Phosphatase: 92 IU/L (ref 48–121)
BUN/Creatinine Ratio: 11 (ref 10–24)
BUN: 20 mg/dL (ref 8–27)
Bilirubin Total: 0.9 mg/dL (ref 0.0–1.2)
CO2: 25 mmol/L (ref 20–29)
Calcium: 9.5 mg/dL (ref 8.6–10.2)
Chloride: 98 mmol/L (ref 96–106)
Creatinine, Ser: 1.8 mg/dL — ABNORMAL HIGH (ref 0.76–1.27)
GFR calc Af Amer: 41 mL/min/{1.73_m2} — ABNORMAL LOW (ref >59)
GFR calc non Af Amer: 35 mL/min/{1.73_m2} — ABNORMAL LOW (ref >59)
Globulin, Total: 2.9 g/dL (ref 1.5–4.5)
Glucose: 88 mg/dL (ref 65–99)
Potassium: 4.7 mmol/L (ref 3.5–5.2)
Sodium: 136 mmol/L (ref 134–144)
Total Protein: 7.4 g/dL (ref 6.0–8.5)

## 2019-12-17 LAB — CBC
Hematocrit: 41.8 % (ref 37.5–51.0)
Hemoglobin: 14.2 g/dL (ref 13.0–17.7)
MCH: 34.9 pg — ABNORMAL HIGH (ref 26.6–33.0)
MCHC: 34 g/dL (ref 31.5–35.7)
MCV: 103 fL — ABNORMAL HIGH (ref 79–97)
Platelets: 261 10*3/uL (ref 150–450)
RBC: 4.07 x10E6/uL — ABNORMAL LOW (ref 4.14–5.80)
RDW: 12.8 % (ref 11.6–15.4)
WBC: 5.2 10*3/uL (ref 3.4–10.8)

## 2019-12-17 LAB — TSH: TSH: 2.84 u[IU]/mL (ref 0.450–4.500)

## 2019-12-17 NOTE — Patient Instructions (Signed)
Medication Instructions:   NO CHANGE *If you need a refill on your cardiac medications before your next appointment, please call your pharmacy*   Lab Work: Your physician recommends that you HAVE LAB WORK TODAY  If you have labs (blood work) drawn today and your tests are completely normal, you will receive your results only by: Marland Kitchen MyChart Message (if you have MyChart) OR . A paper copy in the mail If you have any lab test that is abnormal or we need to change your treatment, we will call you to review the results.   Testing/Procedures:  A chest x-ray takes a picture of the organs and structures inside the chest, including the heart, lungs, and blood vessels. This test can show several things, including, whether the heart is enlarges; whether fluid is building up in the lungs; and whether pacemaker / defibrillator leads are still in place. [place]: At [facility], you and your health needs are our priority.  As part of our continuing mission to provide you with exceptional heart care, we have created designated Provider Care Teams.  These Care Teams include your primary Cardiologist (physician) and Advanced Practice Providers (APPs -  Physician Assistants and Nurse Practitioners) who all work together to provide you with the care you need, when you need it.  We recommend signing up for the patient portal called "MyChart".  Sign up information is provided on this After Visit Summary.  MyChart is used to connect with patients for Virtual Visits (Telemedicine).  Patients are able to view lab/test results, encounter notes, upcoming appointments, etc.  Non-urgent messages can be sent to your provider as well.   To learn more about what you can do with MyChart, go to [email or web address].    Your next appointment:   6 month(s)  The format for your next appointment:   In Person  Provider:   You may see Kirk Ruths, MD or one of the following  Advanced Practice Providers on your designated Care Team:    Kerin Ransom, PA-C  [place], [place]  Coletta Memos, [address]

## 2019-12-21 DIAGNOSIS — H18893 Other specified disorders of cornea, bilateral: Secondary | ICD-10-CM | POA: Diagnosis not present

## 2019-12-23 DIAGNOSIS — M25551 Pain in right hip: Secondary | ICD-10-CM | POA: Diagnosis not present

## 2019-12-23 DIAGNOSIS — M5416 Radiculopathy, lumbar region: Secondary | ICD-10-CM | POA: Diagnosis not present

## 2020-01-04 DIAGNOSIS — M25551 Pain in right hip: Secondary | ICD-10-CM | POA: Diagnosis not present

## 2020-01-04 DIAGNOSIS — M5416 Radiculopathy, lumbar region: Secondary | ICD-10-CM | POA: Diagnosis not present

## 2020-01-11 DIAGNOSIS — M25551 Pain in right hip: Secondary | ICD-10-CM | POA: Diagnosis not present

## 2020-01-11 DIAGNOSIS — M5416 Radiculopathy, lumbar region: Secondary | ICD-10-CM | POA: Diagnosis not present

## 2020-01-18 DIAGNOSIS — E785 Hyperlipidemia, unspecified: Secondary | ICD-10-CM | POA: Diagnosis not present

## 2020-01-31 DIAGNOSIS — E785 Hyperlipidemia, unspecified: Secondary | ICD-10-CM | POA: Diagnosis not present

## 2020-01-31 DIAGNOSIS — D692 Other nonthrombocytopenic purpura: Secondary | ICD-10-CM | POA: Diagnosis not present

## 2020-01-31 DIAGNOSIS — I10 Essential (primary) hypertension: Secondary | ICD-10-CM | POA: Diagnosis not present

## 2020-01-31 DIAGNOSIS — H6123 Impacted cerumen, bilateral: Secondary | ICD-10-CM | POA: Diagnosis not present

## 2020-01-31 DIAGNOSIS — D6869 Other thrombophilia: Secondary | ICD-10-CM | POA: Diagnosis not present

## 2020-01-31 DIAGNOSIS — N1831 Chronic kidney disease, stage 3a: Secondary | ICD-10-CM | POA: Diagnosis not present

## 2020-01-31 DIAGNOSIS — R82998 Other abnormal findings in urine: Secondary | ICD-10-CM | POA: Diagnosis not present

## 2020-01-31 DIAGNOSIS — M5416 Radiculopathy, lumbar region: Secondary | ICD-10-CM | POA: Diagnosis not present

## 2020-01-31 DIAGNOSIS — I110 Hypertensive heart disease with heart failure: Secondary | ICD-10-CM | POA: Diagnosis not present

## 2020-01-31 DIAGNOSIS — I4891 Unspecified atrial fibrillation: Secondary | ICD-10-CM | POA: Diagnosis not present

## 2020-01-31 DIAGNOSIS — M25551 Pain in right hip: Secondary | ICD-10-CM | POA: Diagnosis not present

## 2020-01-31 DIAGNOSIS — Z23 Encounter for immunization: Secondary | ICD-10-CM | POA: Diagnosis not present

## 2020-01-31 DIAGNOSIS — Z0000 Encounter for general adult medical examination without abnormal findings: Secondary | ICD-10-CM | POA: Diagnosis not present

## 2020-01-31 DIAGNOSIS — I5022 Chronic systolic (congestive) heart failure: Secondary | ICD-10-CM | POA: Diagnosis not present

## 2020-02-03 DIAGNOSIS — M9904 Segmental and somatic dysfunction of sacral region: Secondary | ICD-10-CM | POA: Diagnosis not present

## 2020-02-03 DIAGNOSIS — M9905 Segmental and somatic dysfunction of pelvic region: Secondary | ICD-10-CM | POA: Diagnosis not present

## 2020-02-03 DIAGNOSIS — M9903 Segmental and somatic dysfunction of lumbar region: Secondary | ICD-10-CM | POA: Diagnosis not present

## 2020-02-03 DIAGNOSIS — M5136 Other intervertebral disc degeneration, lumbar region: Secondary | ICD-10-CM | POA: Diagnosis not present

## 2020-02-07 DIAGNOSIS — M5136 Other intervertebral disc degeneration, lumbar region: Secondary | ICD-10-CM | POA: Diagnosis not present

## 2020-02-07 DIAGNOSIS — M9905 Segmental and somatic dysfunction of pelvic region: Secondary | ICD-10-CM | POA: Diagnosis not present

## 2020-02-07 DIAGNOSIS — M9903 Segmental and somatic dysfunction of lumbar region: Secondary | ICD-10-CM | POA: Diagnosis not present

## 2020-02-07 DIAGNOSIS — M9904 Segmental and somatic dysfunction of sacral region: Secondary | ICD-10-CM | POA: Diagnosis not present

## 2020-02-09 DIAGNOSIS — M9904 Segmental and somatic dysfunction of sacral region: Secondary | ICD-10-CM | POA: Diagnosis not present

## 2020-02-09 DIAGNOSIS — M9905 Segmental and somatic dysfunction of pelvic region: Secondary | ICD-10-CM | POA: Diagnosis not present

## 2020-02-09 DIAGNOSIS — M9903 Segmental and somatic dysfunction of lumbar region: Secondary | ICD-10-CM | POA: Diagnosis not present

## 2020-02-09 DIAGNOSIS — M5136 Other intervertebral disc degeneration, lumbar region: Secondary | ICD-10-CM | POA: Diagnosis not present

## 2020-02-10 DIAGNOSIS — M5136 Other intervertebral disc degeneration, lumbar region: Secondary | ICD-10-CM | POA: Diagnosis not present

## 2020-02-10 DIAGNOSIS — M9904 Segmental and somatic dysfunction of sacral region: Secondary | ICD-10-CM | POA: Diagnosis not present

## 2020-02-10 DIAGNOSIS — M9903 Segmental and somatic dysfunction of lumbar region: Secondary | ICD-10-CM | POA: Diagnosis not present

## 2020-02-10 DIAGNOSIS — M9905 Segmental and somatic dysfunction of pelvic region: Secondary | ICD-10-CM | POA: Diagnosis not present

## 2020-02-18 DIAGNOSIS — Z1212 Encounter for screening for malignant neoplasm of rectum: Secondary | ICD-10-CM | POA: Diagnosis not present

## 2020-02-29 DIAGNOSIS — M5136 Other intervertebral disc degeneration, lumbar region: Secondary | ICD-10-CM | POA: Diagnosis not present

## 2020-03-05 ENCOUNTER — Other Ambulatory Visit: Payer: Self-pay | Admitting: Cardiology

## 2020-03-05 DIAGNOSIS — I4819 Other persistent atrial fibrillation: Secondary | ICD-10-CM

## 2020-03-06 DIAGNOSIS — Z23 Encounter for immunization: Secondary | ICD-10-CM | POA: Diagnosis not present

## 2020-03-20 DIAGNOSIS — M5416 Radiculopathy, lumbar region: Secondary | ICD-10-CM | POA: Diagnosis not present

## 2020-03-31 ENCOUNTER — Encounter: Payer: Self-pay | Admitting: Podiatry

## 2020-03-31 ENCOUNTER — Other Ambulatory Visit: Payer: Self-pay

## 2020-03-31 ENCOUNTER — Ambulatory Visit (INDEPENDENT_AMBULATORY_CARE_PROVIDER_SITE_OTHER): Payer: Medicare Other | Admitting: Podiatry

## 2020-03-31 ENCOUNTER — Ambulatory Visit (INDEPENDENT_AMBULATORY_CARE_PROVIDER_SITE_OTHER): Payer: Medicare Other

## 2020-03-31 DIAGNOSIS — M2042 Other hammer toe(s) (acquired), left foot: Secondary | ICD-10-CM | POA: Diagnosis not present

## 2020-03-31 DIAGNOSIS — M2041 Other hammer toe(s) (acquired), right foot: Secondary | ICD-10-CM | POA: Diagnosis not present

## 2020-03-31 DIAGNOSIS — L600 Ingrowing nail: Secondary | ICD-10-CM

## 2020-03-31 NOTE — Patient Instructions (Signed)

## 2020-04-01 DIAGNOSIS — M5416 Radiculopathy, lumbar region: Secondary | ICD-10-CM | POA: Diagnosis not present

## 2020-04-02 NOTE — Progress Notes (Signed)
Subjective:   Patient ID: Patrick [name], male   DOB: 79 y.o.   MRN: [id number]   HPI Patient states have had an ingrown toenail my right big toe second toe that I have tried to work on myself and states they get tender and make it hard to wear shoe gear comfortably. States it is incurvated in the corner and sore with pressure   ROS      Objective:  Physical Exam  Neurovascular status intact with incurvated nail bed right hallux second toes with slight redness in the corner no drainage or other active infective process noted with good digital perfusion of the toes     Assessment:  Ingrown toenail deformity right hallux right second toe     Plan:  H&P reviewed condition recommended nail correction. Explained procedure risk the patient and he wants to have these faxed and understands risk and today I infiltrated each toe 60 mg like Marcaine mixture sterile prep applied and using sterile instrumentation removed the right hallux nail corner at the right second nail corner exposed the matrix applied phenol 3 applications 30 seconds followed by alcohol lavage sterile dressing and gave instructions on soaks and to leave dressings on 24 hours but take them off earlier if any throbbing were to occur and encouraged him to call with questions concerns

## 2020-04-10 DIAGNOSIS — R3121 Asymptomatic microscopic hematuria: Secondary | ICD-10-CM | POA: Diagnosis not present

## 2020-04-17 DIAGNOSIS — Z8546 Personal history of malignant neoplasm of prostate: Secondary | ICD-10-CM | POA: Diagnosis not present

## 2020-04-17 DIAGNOSIS — N5201 Erectile dysfunction due to arterial insufficiency: Secondary | ICD-10-CM | POA: Diagnosis not present

## 2020-04-17 DIAGNOSIS — R3121 Asymptomatic microscopic hematuria: Secondary | ICD-10-CM | POA: Diagnosis not present

## 2020-04-20 DIAGNOSIS — D225 Melanocytic nevi of trunk: Secondary | ICD-10-CM | POA: Diagnosis not present

## 2020-04-20 DIAGNOSIS — Z85828 Personal history of other malignant neoplasm of skin: Secondary | ICD-10-CM | POA: Diagnosis not present

## 2020-04-20 DIAGNOSIS — L570 Actinic keratosis: Secondary | ICD-10-CM | POA: Diagnosis not present

## 2020-04-20 DIAGNOSIS — L814 Other melanin hyperpigmentation: Secondary | ICD-10-CM | POA: Diagnosis not present

## 2020-04-20 DIAGNOSIS — L821 Other seborrheic keratosis: Secondary | ICD-10-CM | POA: Diagnosis not present

## 2020-04-20 DIAGNOSIS — L820 Inflamed seborrheic keratosis: Secondary | ICD-10-CM | POA: Diagnosis not present

## 2020-04-23 IMAGING — CT CT ANGIO CHEST
2 of 7 series · 15 of 46 positions shown · IV contrast (iopamidol)
Comparison: 03/12/2006

CLINICAL DATA: Chest and back pain. Rule out aortic dissection.
Atrial fibrillation.

EXAM:
CT ANGIOGRAPHY CHEST WITH CONTRAST
TECHNIQUE: Multidetector CT imaging of the chest was performed using the
standard protocol during bolus administration of intravenous
contrast. Multiplanar CT image reconstructions and MIPs were
obtained to evaluate the vascular anatomy.
CONTRAST:  100mL SLVHE0-BDV IOPAMIDOL (SLVHE0-BDV) INJECTION 76%

[Series 11: dissection 2mm cor · coronal · 0.73mm/px · 3 of 138 slices shown]
[im 35/138  soft-tissue]
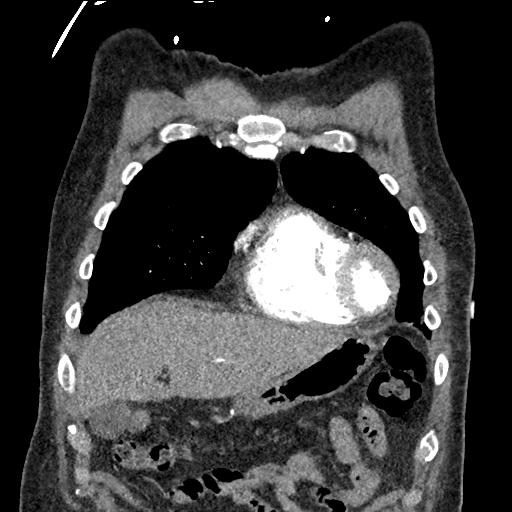
[im 69/138  soft-tissue]
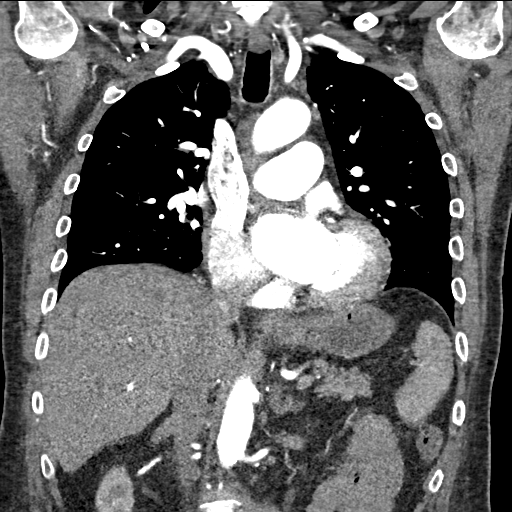
[im 103/138  soft-tissue]
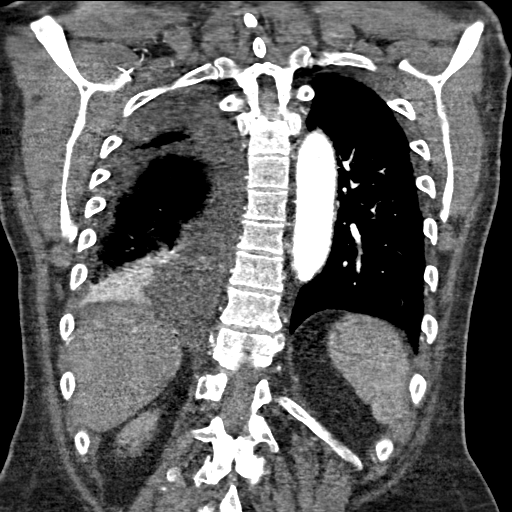

[Series 15: dissection 2mm · axial · 0.76mm/px · z∈[+2,+316]mm · 12 of 187 slices shown]
[im 15/187  lung]
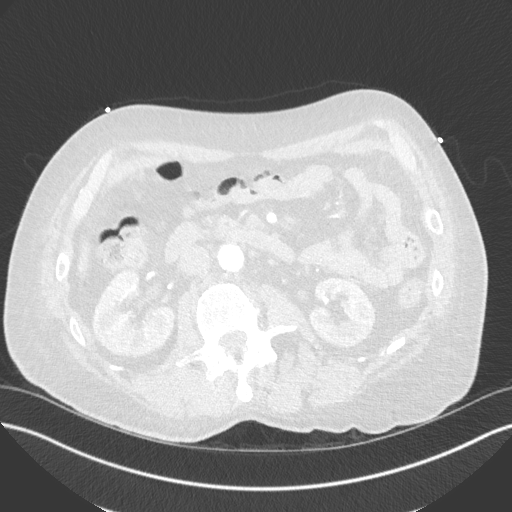
[im 29/187  soft-tissue]
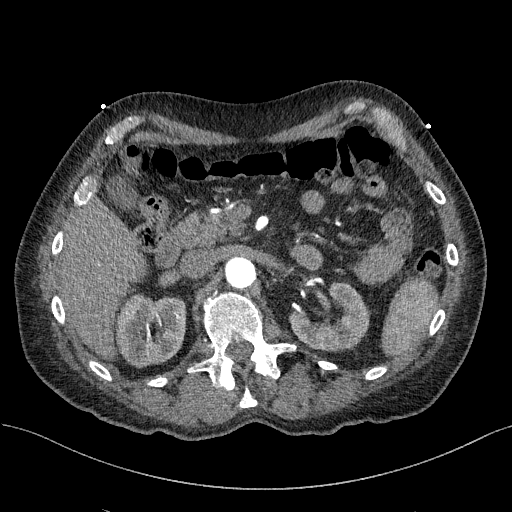
[im 43/187  lung]
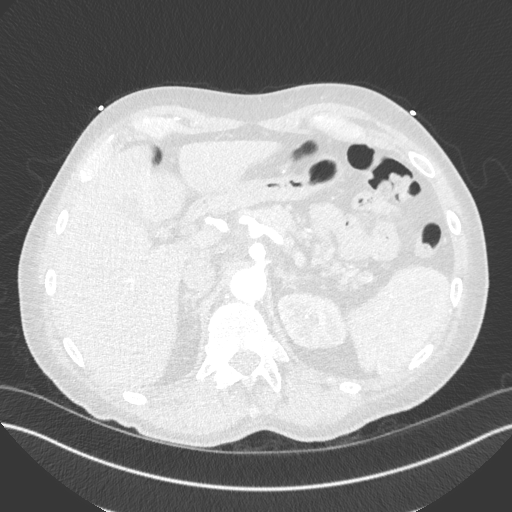
[im 58/187  soft-tissue]
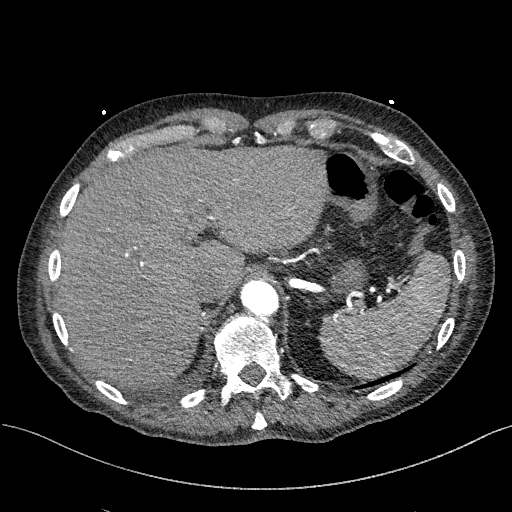
[im 72/187  lung]
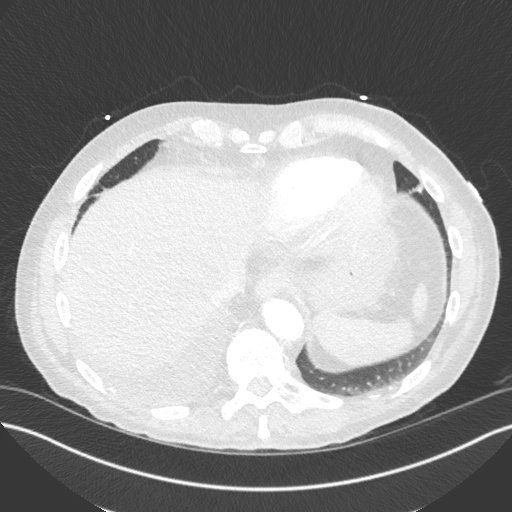
[im 86/187  soft-tissue]
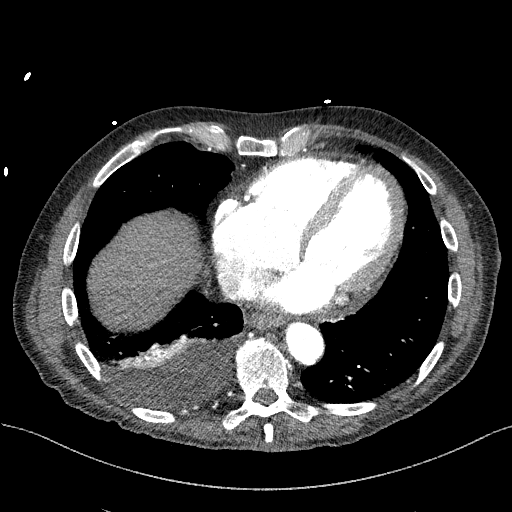
[im 101/187  lung]
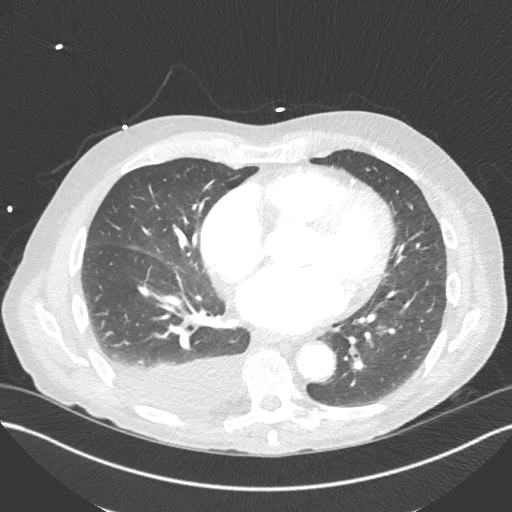
[im 115/187  soft-tissue]
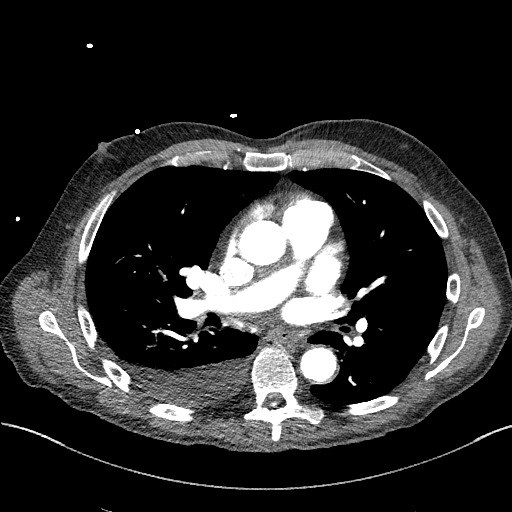
[im 129/187  lung]
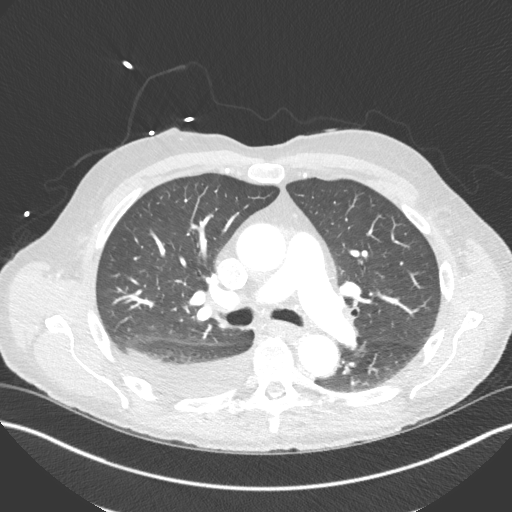
[im 144/187  soft-tissue]
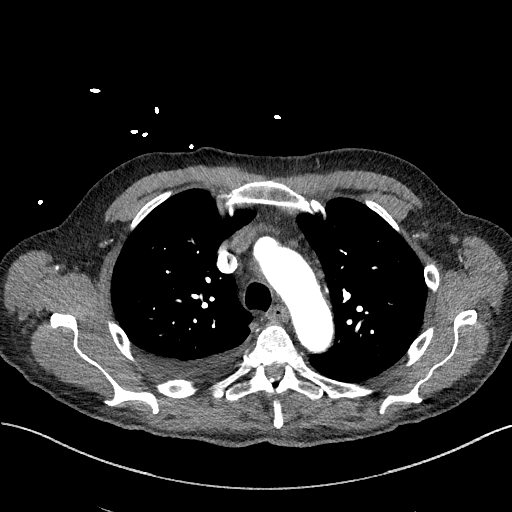
[im 158/187  lung]
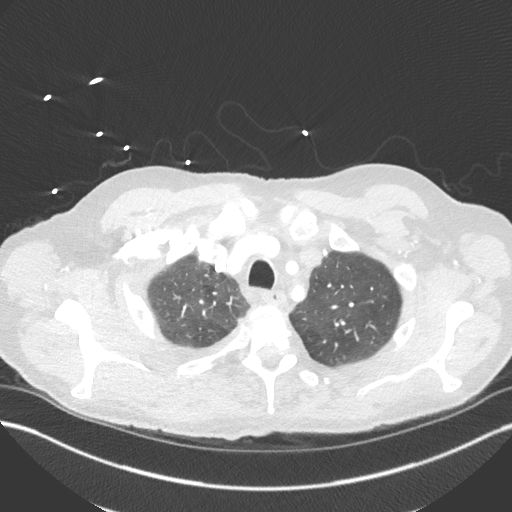
[im 172/187  soft-tissue]
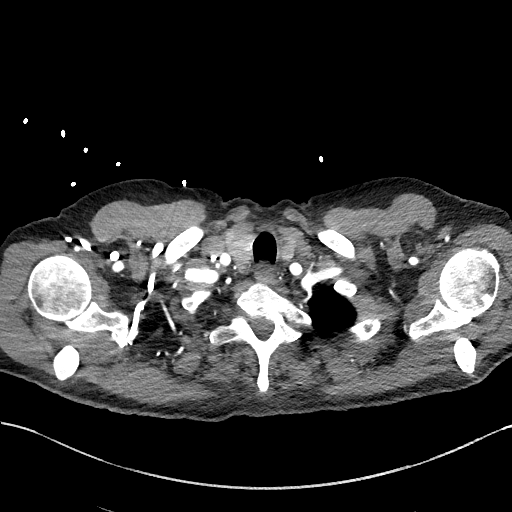

[15 of 46 positions shown; findings below may reference images not displayed]

FINDINGS: Cardiovascular: There is no evidence of aortic dissection or acute
intramural hematoma. Maximal diameter of the ascending aorta at the
sinus of Valsalva, Danii junction, and ascending aorta are
3.5 cm, 2.8 cm, and 3.5 cm respectively. Aortic atherosclerotic
calcifications are noted. Moderate LAD territory coronary artery
calcifications. Great vessels are patent. There is no obvious
evidence of acute pulmonary thromboembolism.

Mediastinum/Nodes: Small scattered mediastinal nodes are noted.
There is stranding throughout the mediastinum. Physiologic
pericardial fluid. Esophagus and visualized thyroid are within
normal limits.

Lungs/Pleura: Moderate right pleural effusion with dependent
atelectasis in the right lower lobe. No pneumothorax. Left lung is
clear.

Upper Abdomen: No acute abnormality.

Musculoskeletal: Mild scoliosis in the thoracolumbar spine. No
vertebral compression deformity.

Review of the MIP images confirms the above findings.
IMPRESSION: Vascular:

No evidence of aortic dissection or acute intramural hematoma. No
obvious acute pulmonary thromboembolism.

Nonvascular:

There is stranding within the mediastinum and a right pleural
effusion. These findings suggest an element of volume overload.

Aortic Atherosclerosis ([id number]).

## 2020-04-26 DIAGNOSIS — M5416 Radiculopathy, lumbar region: Secondary | ICD-10-CM | POA: Diagnosis not present

## 2020-05-04 ENCOUNTER — Encounter: Payer: Self-pay | Admitting: Internal Medicine

## 2020-05-04 ENCOUNTER — Ambulatory Visit (INDEPENDENT_AMBULATORY_CARE_PROVIDER_SITE_OTHER): Payer: Medicare Other | Admitting: Internal Medicine

## 2020-05-04 ENCOUNTER — Telehealth: Payer: Self-pay

## 2020-05-04 VITALS — BP 124/82 | HR 68 | Ht 71.65 in | Wt 204.0 lb

## 2020-05-04 DIAGNOSIS — Z86010 Personal history of colonic polyps: Secondary | ICD-10-CM | POA: Diagnosis not present

## 2020-05-04 DIAGNOSIS — Z7901 Long term (current) use of anticoagulants: Secondary | ICD-10-CM

## 2020-05-04 MED ORDER — SUTAB 1479-225-188 MG PO TABS: 1.0000 | ORAL_TABLET | Freq: Once | ORAL | 0 refills | Status: AC

## 2020-05-04 NOTE — Progress Notes (Signed)
HISTORY OF PRESENT ILLNESS:  Patrick [name] is a 80 y.o. male who presents today for follow-up regarding surveillance colonoscopy.  Previous examinations were performed 2006, 2013, and 2018.  He has a history of multiple adenomatous colon polyps with 6 polyps on his most recent examination.  Follow-up in 3 years recommended.  He did receive a recall letter.  Patient has a history of atrial fibrillation for which he is on Eliquis.  Did have transient problems with congestive heart failure which resolved after cardioversion.  He remains on amiodarone in addition to Eliquis.  His most recent echo performed June 2020 revealed left ventricular function 55 to 60%.  His GI review of systems is negative.  Non-GI review of systems is also negative.  Review of blood work August 2021 shows normal comprehensive metabolic panel except for creatinine 1.8.  Unremarkable CBC with hemoglobin 14.2.   He has completed his COVID vaccination series and booster.   REVIEW OF SYSTEMS:  All non-GI ROS negative.  Past Medical History:  Diagnosis Date  . Allergy   . Atrial tachycardia (HCC)   . Blood transfusion 1944  . Cataract   . Diverticulosis   . Heart murmur   . Hemorrhoids   . Hyperlipemia   . Prostate cancer [facility])    Gleason 6 status post radiation seed implant September 2010  . Rheumatic heart disease    80yrs old    Past Surgical History:  Procedure Laterality Date  . CARDIAC ELECTROPHYSIOLOGY STUDY AND ABLATION  2011   for SVT  . CARDIOVERSION N/A 05/18/2018   Procedure: CARDIOVERSION;  Surgeon: Jodelle Red, MD;  Location: [facility] ENDOSCOPY;  Service: Cardiovascular;  Laterality: N/A;  . CATARACT EXTRACTION W/ INTRAOCULAR LENS IMPLANT  2009   right  . INGUINAL HERNIA REPAIR  01/2006   right  . MELANOMA EXCISION  2011   superficial resection left maxillary area/ Dr Danella Deis  . TEE WITHOUT CARDIOVERSION N/A 05/18/2018   Procedure: TRANSESOPHAGEAL ECHOCARDIOGRAM (TEE);  Surgeon: Jodelle Red, MD;  Location: [facility] ENDOSCOPY;  Service: Cardiovascular;  Laterality: N/A;  . TONSILLECTOMY AND ADENOIDECTOMY  1945  . TOTAL KNEE ARTHROPLASTY  1/11   s/p left  . WRIST FUSION  2006   04/2004    Social History Patrick [name]  reports that he has never smoked. He has never used smokeless tobacco. He reports that he does not drink alcohol and does not use drugs.  family history includes Arthritis in an other family member; Breast cancer in his maternal grandmother and mother; Cancer in an other family member.  Allergies  Allergen Reactions  . [place]a Arthritis Strength-Antacid [Aspirin Buffered] Swelling    angioedema  . Penicillins Rash    DID THE REACTION INVOLVE: Swelling of the face/tongue/throat, SOB, or low BP? No Sudden or severe rash/hives, skin peeling, or the inside of the mouth or nose? No Did it require medical treatment? No When did it last happen?50+ years ago If all above answers are "NO", may proceed with cephalosporin use.        PHYSICAL EXAMINATION: Vital signs: BP 124/82 (BP Location: Left Arm, Patient Position: Sitting, Cuff Size: Normal)   Pulse 68 Comment: irregular  Ht 5' 11.65" (1.82 m) Comment: height measured without shoes  Wt 204 lb (92.5 kg)   BMI 27.94 kg/m   Constitutional: generally well-appearing, no acute distress Psychiatric: alert and oriented x3, cooperative Eyes: extraocular movements intact, anicteric, conjunctiva pink Mouth: oral pharynx moist, no lesions Neck: supple no lymphadenopathy Cardiovascular: heart  regular rate and rhythm. Lungs: clear to auscultation bilaterally Abdomen: soft, nontender, nondistended, no obvious ascites, no peritoneal signs, normal bowel sounds, no organomegaly Rectal: Deferred until colonoscopy Extremities: no clubbing, cyanosis, or lower extremity edema bilaterally Skin: no lesions on visible extremities Neuro: No focal deficits.  Cranial nerves intact  ASSESSMENT:  1.  History of multiple  adenomatous colon polyps.  Due for surveillance 2.  History of atrial fibrillation on Eliquis.  Patient has been in sinus rhythm after successful cardioversion   PLAN:  1.  Surveillance colonoscopy.  The patient is HIGH RISK given his comorbidities and the need to address his chronic anticoagulation.The nature of the procedure, as well as the risks, benefits, and alternatives were carefully and thoroughly reviewed with the patient. Ample time for discussion and questions allowed. The patient understood, was satisfied, and agreed to proceed. 2.  Eliquis 2 days prior to his procedure (GFR 35).  I would anticipate resumption of Eliquis immediately post procedure (unless he had overwhelming pathology requiring extensive therapeutics).  We will confirm the acceptability with his cardiologist, Dr. Stanford Breed

## 2020-05-04 NOTE — Patient Instructions (Signed)
If you are age 79 or older, your body mass index should be between 23-30. Your Body mass index is 27.94 kg/m. If this is out of the aforementioned range listed, please consider follow up with your Primary Care Provider.  If you are age 53 or younger, your body mass index should be between 19-25. Your Body mass index is 27.94 kg/m. If this is out of the aformentioned range listed, please consider follow up with your Primary Care Provider.   You have been scheduled for a colonoscopy. Please follow written instructions given to you at your visit today.  Please pick up your prep supplies at the pharmacy within the next 1-3 days. If you use inhalers (even only as needed), please bring them with you on the day of your procedure.

## 2020-05-04 NOTE — Telephone Encounter (Signed)
   Primary Cardiologist: Olga Millers, MD  Chart reviewed as part of pre-operative protocol coverage. We have been contacted to provide pharmacy clearance prior to procedure. Per our pharmacy team:  Patient with diagnosis of A Fib on Eliquis for anticoagulation.    Procedure: colonoscopy Date of procedure: 07/04/20  CHA2DS2-VASc Score = 4  This indicates a 4.8% annual risk of stroke. The patient's score is based upon: CHF History: Yes HTN History: Yes Diabetes History: No Stroke History: No Vascular Disease History: No Age Score: 2 Gender Score: 0   CrCl 44 mL/min Platelet count 261K  Per office protocol, patient can hold Eliquis for 2 days prior to procedure.    I will route this recommendation to the requesting party via Epic fax function and remove from pre-op pool.  Please call with questions.  Georgie Chard, NP 05/04/2020, 2:39 PM

## 2020-05-04 NOTE — Telephone Encounter (Signed)
Patient with diagnosis of A Fib on Eliquis for anticoagulation.    Procedure: colonoscopy Date of procedure: 07/04/20   CHA2DS2-VASc Score = 4  This indicates a 4.8% annual risk of stroke. The patient's score is based upon: CHF History: Yes HTN History: Yes Diabetes History: No Stroke History: No Vascular Disease History: No Age Score: 2 Gender Score: 0    CrCl 44 mL/min Platelet count 261K   Per office protocol, patient can hold Eliquis for 2 days prior to procedure.

## 2020-05-04 NOTE — Telephone Encounter (Signed)
[facility] Pre-operative Risk Assessment     Request for surgical clearance:     Endoscopy Procedure  What type of surgery is being performed?     Colonoscopy  When is this surgery scheduled?     07/04/2020  What type of clearance is required ?   Pharmacy  Are there any medications that need to be held prior to surgery and how long? Eliquis - 2 days  Practice name and name of physician performing surgery?      [facility]  What is your office phone and fax number?      Phone- [phone]  [phone]  Anesthesia type (None, local, MAC, general) ?       MAC

## 2020-05-05 DIAGNOSIS — R3121 Asymptomatic microscopic hematuria: Secondary | ICD-10-CM | POA: Diagnosis not present

## 2020-05-08 ENCOUNTER — Telehealth: Payer: Self-pay | Admitting: Internal Medicine

## 2020-05-08 NOTE — Telephone Encounter (Signed)
Pt is requesting a call back from a nurse to discuss his prep medication SUTAB is costing more than $100, pt would like a cheaper alternative.

## 2020-05-09 NOTE — Telephone Encounter (Signed)
Spoke with patient and told him that the Sutab rep had called the pharmacy and gotten the prep down to $40.  Also told him that per Dr. Stanford Breed, he could hold his Eliquis for 2 days prior to his procedure.  Patient agreed.

## 2020-05-17 DIAGNOSIS — R3121 Asymptomatic microscopic hematuria: Secondary | ICD-10-CM | POA: Diagnosis not present

## 2020-05-17 DIAGNOSIS — N3041 Irradiation cystitis with hematuria: Secondary | ICD-10-CM | POA: Diagnosis not present

## 2020-05-22 DIAGNOSIS — M5416 Radiculopathy, lumbar region: Secondary | ICD-10-CM | POA: Diagnosis not present

## 2020-05-30 ENCOUNTER — Other Ambulatory Visit: Payer: Self-pay | Admitting: Cardiology

## 2020-05-30 DIAGNOSIS — M5416 Radiculopathy, lumbar region: Secondary | ICD-10-CM | POA: Diagnosis not present

## 2020-05-30 DIAGNOSIS — I4819 Other persistent atrial fibrillation: Secondary | ICD-10-CM

## 2020-06-07 NOTE — Progress Notes (Signed)
HPI: FU CM and atrial fibrillation. Patient has had previous ablation of atrial tachycardia in 2011.Patient seen December 2019 with complaints of palpitations. He was found to be in atrial tachycardia. Patient was placed on beta-blocker. He was felt to be at increased risk for repeat ablationgivenhistory of rheumatic fever. Echo was ordered and showed newly reduced LV function with ejection fraction 20 to 25%, mild left ventricular hypertrophy, mild mitral regurgitation. His heart rate during the study was 120-160.Had TEE on May 18, 2018. Ejection fraction 20 to 25%, mild mitral regurgitation, no left atrial appendage thrombus. Patient successfully cardioverted. Note Dr. [name] had planned to admit afterwards and begin Tikosyn but patient declined as his wife had a cardiac arrest on Tikosyn.Found to be in atrial fibrillation at follow-up office visit. Amiodarone initiatedand patient converted to sinus rhythm. Echocardiogram repeated June 2020 and showed normal LV function. There was mild left atrial enlargement and trace aortic insufficiency. Since last seen  there is no dyspnea, chest pain, palpitations or syncope.  Current Outpatient Medications  Medication Sig Dispense Refill  . acetaminophen (TYLENOL) 500 MG tablet Take 500 mg by mouth every 6 (six) hours as needed (shoulder pain).     Marland Kitchen amiodarone (PACERONE) 200 MG tablet Take 1 tablet (200 mg total) by mouth daily. 90 tablet 3  . ELIQUIS 5 MG TABS tablet TAKE 1 TABLET BY MOUTH TWICE A DAY 180 tablet 1  . losartan (COZAAR) 25 MG tablet Take 25 mg by mouth daily.    Marland Kitchen Propylene Glycol 0.6 % SOLN Apply 1 drop to eye at bedtime.     No current facility-administered medications for this visit.     Past Medical History:  Diagnosis Date  . Allergy   . Atrial tachycardia ([place])   . Blood transfusion 1944  . Cataract   . Diverticulosis   . Heart murmur   . Hemorrhoids   . Hyperlipemia   . Prostate cancer [facility])    Gleason 6  status post radiation seed implant September 2010  . Rheumatic heart disease    80yrs old    Past Surgical History:  Procedure Laterality Date  . CARDIAC ELECTROPHYSIOLOGY STUDY AND ABLATION  2011   for SVT  . CARDIOVERSION N/A 05/18/2018   Procedure: CARDIOVERSION;  Surgeon: Buford Dresser, MD;  Location: [place];  Service: Cardiovascular;  Laterality: N/A;  . CATARACT EXTRACTION W/ INTRAOCULAR LENS IMPLANT  2009   right  . INGUINAL HERNIA REPAIR  01/2006   right  . MELANOMA EXCISION  2011   superficial resection left maxillary area/ Dr [name]  . TEE WITHOUT CARDIOVERSION N/A 05/18/2018   Procedure: TRANSESOPHAGEAL ECHOCARDIOGRAM (TEE);  Surgeon: Buford Dresser, MD;  Location: [facility] ENDOSCOPY;  Service: Cardiovascular;  Laterality: N/A;  . [place]  . TOTAL KNEE ARTHROPLASTY  1/11   s/p left  . WRIST FUSION  2006   04/2004    Social History   Socioeconomic History  . Marital status: Widowed    Spouse name: Not on file  . Number of children: Not on file  . Years of education: Not on file  . Highest education level: Not on file  Occupational History  . Occupation: Retired    Fish farm manager: [employer]  Tobacco Use  . Smoking status: Never Smoker  . Smokeless tobacco: Never Used  Substance and Sexual Activity  . Alcohol use: No  . Drug use: No  . Sexual activity: Not Currently  Other Topics Concern  .  Not on file  Social History Narrative   Regular exercise-Yes   Married   Lives in [place]   Social Determinants of Health   Financial Resource Strain: Not on file  Food Insecurity: Not on file  Transportation Needs: Not on file  Physical Activity: Not on file  Stress: Not on file  Social Connections: Not on file  Intimate Partner Violence: Not on file    Family History  Problem Relation Age of Onset  . Arthritis Other   . Cancer Other        Breast cancer 1st degree relative<50  . Breast cancer Mother   .  Breast cancer Maternal Grandmother   . Colon cancer Neg Hx   . Esophageal cancer Neg Hx   . Pancreatic cancer Neg Hx   . Rectal cancer Neg Hx   . Stomach cancer Neg Hx     ROS: no fevers or chills, productive cough, hemoptysis, dysphasia, odynophagia, melena, hematochezia, dysuria, hematuria, rash, seizure activity, orthopnea, PND, pedal edema, claudication. Remaining systems are negative.  Physical Exam: Well-developed well-nourished in no acute distress.  Skin is warm and dry.  HEENT is normal.  Neck is supple.  Chest is clear to auscultation with normal expansion.  Cardiovascular exam is regular rate and rhythm.  Abdominal exam nontender or distended. No masses palpated. Extremities show no edema. neuro grossly intact  ECG-sinus rhythm with occasional PVC, left anterior fascicular block. Personally reviewed  A/P  1 paroxysmal atrial fibrillation-patient is in sinus rhythm today.  Continue amiodarone at present dose.  Continue apixaban.  If he has more frequent episodes in the future we will plan referral for ablation.  2 hypertension-blood pressure controlled; continue present medications and follow.  3 history of cardiomyopathy-this was previously felt to be likely tachycardia mediated.  LV function improved on most recent echocardiogram.  4 atrial tachycardia-status post ablation.  Kirk Ruths, MD

## 2020-06-10 DIAGNOSIS — M5416 Radiculopathy, lumbar region: Secondary | ICD-10-CM | POA: Diagnosis not present

## 2020-06-20 ENCOUNTER — Encounter: Payer: Self-pay | Admitting: Cardiology

## 2020-06-20 ENCOUNTER — Ambulatory Visit (INDEPENDENT_AMBULATORY_CARE_PROVIDER_SITE_OTHER): Payer: Medicare Other | Admitting: Cardiology

## 2020-06-20 ENCOUNTER — Other Ambulatory Visit: Payer: Self-pay

## 2020-06-20 VITALS — BP 134/68 | HR 71 | Ht 73.0 in | Wt 218.0 lb

## 2020-06-20 DIAGNOSIS — I428 Other cardiomyopathies: Secondary | ICD-10-CM | POA: Diagnosis not present

## 2020-06-20 DIAGNOSIS — I480 Paroxysmal atrial fibrillation: Secondary | ICD-10-CM | POA: Diagnosis not present

## 2020-06-20 NOTE — Patient Instructions (Signed)

## 2020-06-21 DIAGNOSIS — H18893 Other specified disorders of cornea, bilateral: Secondary | ICD-10-CM | POA: Diagnosis not present

## 2020-06-23 DIAGNOSIS — M5416 Radiculopathy, lumbar region: Secondary | ICD-10-CM | POA: Diagnosis not present

## 2020-07-04 ENCOUNTER — Encounter: Payer: Self-pay | Admitting: Internal Medicine

## 2020-07-04 ENCOUNTER — Ambulatory Visit (AMBULATORY_SURGERY_CENTER): Payer: Medicare Other | Admitting: Internal Medicine

## 2020-07-04 ENCOUNTER — Other Ambulatory Visit: Payer: Self-pay

## 2020-07-04 VITALS — BP 117/71 | HR 127 | Temp 97.5°F | Resp 15 | Ht 71.0 in | Wt 204.0 lb

## 2020-07-04 DIAGNOSIS — Z86010 Personal history of colonic polyps: Secondary | ICD-10-CM | POA: Diagnosis not present

## 2020-07-04 DIAGNOSIS — D123 Benign neoplasm of transverse colon: Secondary | ICD-10-CM

## 2020-07-04 MED ORDER — SODIUM CHLORIDE 0.9 % IV SOLN: 500.0000 mL | Freq: Once | INTRAVENOUS | Status: DC

## 2020-07-04 NOTE — Progress Notes (Signed)
Pt's states no medical or surgical changes since previsit or office visit. 

## 2020-07-04 NOTE — Progress Notes (Signed)
Called to room to assist during endoscopic procedure.  Patient ID and intended procedure confirmed with present staff. Received instructions for my participation in the procedure from the performing physician.  

## 2020-07-04 NOTE — Progress Notes (Signed)
C.W. vital signs. 

## 2020-07-04 NOTE — Op Note (Signed)
[facility] Patient Name: Patrick [name] Procedure Date: 07/04/2020 9:18 AM MRN: [id number] Endoscopist: Docia Chuck. Henrene Pastor , MD Age: 80 Referring MD:  Date of Birth: 01-14-1941 Gender: Male Account #: [id number] Procedure:                Colonoscopy with cold snare polypectomy x 3 Indications:              High risk colon cancer surveillance: Personal                            history of multiple (3 or more) adenomas. Previous                            examinations 2006, 2013, 2018 Medicines:                Monitored Anesthesia Care Procedure:                Pre-Anesthesia Assessment:                           - Prior to the procedure, a History and Physical                            was performed, and patient medications and                            allergies were reviewed. The patient's tolerance of                            previous anesthesia was also reviewed. The risks                            and benefits of the procedure and the sedation                            options and risks were discussed with the patient.                            All questions were answered, and informed consent                            was obtained. Prior Anticoagulants: The patient has                            taken Eliquis (apixaban), last dose was 3 days                            prior to procedure. ASA Grade Assessment: III - A                            patient with severe systemic disease. After                            reviewing the risks and benefits, the patient was  deemed in satisfactory condition to undergo the                            procedure.                           After obtaining informed consent, the colonoscope                            was passed under direct vision. Throughout the                            procedure, the patient's blood pressure, pulse, and                            oxygen saturations were monitored  continuously. The                            Olympus CF-HQ190L (Serial# [id number]) Colonoscope was                            introduced through the anus and advanced to the the                            cecum, identified by appendiceal orifice and                            ileocecal valve. The ileocecal valve, appendiceal                            orifice, and rectum were photographed. The quality                            of the bowel preparation was excellent. The                            colonoscopy was performed without difficulty. The                            patient tolerated the procedure well. The bowel                            preparation used was SUPREP via split dose                            instruction. Scope In: 9:28:37 AM Scope Out: 9:41:55 AM Scope Withdrawal Time: 0 hours 11 minutes 9 seconds  Total Procedure Duration: 0 hours 13 minutes 18 seconds  Findings:                 Three polyps were found in the transverse colon.                            The polyps were 3 to 4 mm in size. These polyps  were removed with a cold snare. Resection and                            retrieval were complete.                           Multiple diverticula were found in the sigmoid                            colon.                           Internal hemorrhoids were found during retroflexion.                           The exam was otherwise without abnormality on                            direct and retroflexion views. Complications:            No immediate complications. Estimated blood loss:                            None. Estimated Blood Loss:     Estimated blood loss: none. Impression:               - Three 3 to 4 mm polyps in the transverse colon,                            removed with a cold snare. Resected and retrieved.                           - Diverticulosis in the sigmoid colon.                           - Internal hemorrhoids.                            - The examination was otherwise normal on direct                            and retroflexion views. Recommendation:           - Repeat colonoscopy in 3 years for surveillance.                           - Resume Eliquis (apixaban) today at prior dose.                           - Patient has a contact number available for                            emergencies. The signs and symptoms of potential                            delayed complications were discussed with the  patient. Return to normal activities tomorrow.                            Written discharge instructions were provided to the                            patient.                           - Resume previous diet.                           - Continue present medications.                           - Await pathology results. Docia Chuck. Henrene Pastor, MD 07/04/2020 9:48:23 AM This report has been signed electronically.

## 2020-07-04 NOTE — Progress Notes (Signed)
PT taken to PACU. Monitors in place. VSS. Report given to RN. 

## 2020-07-04 NOTE — Patient Instructions (Signed)
RESUME Eliquis today at prior dose.   Handouts Provided:  Polyps and Diverticulosis  YOU HAD AN ENDOSCOPIC PROCEDURE TODAY AT [place]:   Refer to the procedure report that was given to you for any specific questions about what was found during the examination.  If the procedure report does not answer your questions, please call your gastroenterologist to clarify.  If you requested that your care partner not be given the details of your procedure findings, then the procedure report has been included in a sealed envelope for you to review at your convenience later.  YOU SHOULD EXPECT: Some feelings of bloating in the abdomen. Passage of more gas than usual.  Walking can help get rid of the air that was put into your GI tract during the procedure and reduce the bloating. If you had a lower endoscopy (such as a colonoscopy or flexible sigmoidoscopy) you may notice spotting of blood in your stool or on the toilet paper. If you underwent a bowel prep for your procedure, you may not have a normal bowel movement for a few days.  Please Note:  You might notice some irritation and congestion in your nose or some drainage.  This is from the oxygen used during your procedure.  There is no need for concern and it should clear up in a day or so.  SYMPTOMS TO REPORT IMMEDIATELY:   Following lower endoscopy (colonoscopy or flexible sigmoidoscopy):  Excessive amounts of blood in the stool  Significant tenderness or worsening of abdominal pains  Swelling of the abdomen that is new, acute  Fever of 100F or higher  For urgent or emergent issues, a gastroenterologist can be reached at any hour by calling [phone]. Do not use MyChart messaging for urgent concerns.    DIET:  We do recommend a small meal at first, but then you may proceed to your regular diet.  Drink plenty of fluids but you should avoid alcoholic beverages for 24 hours.  ACTIVITY:  You should plan to take it easy for  the rest of today and you should NOT DRIVE or use heavy machinery until tomorrow (because of the sedation medicines used during the test).    FOLLOW UP: Our staff will call the number listed on your records 48-72 hours following your procedure to check on you and address any questions or concerns that you may have regarding the information given to you following your procedure. If we do not reach you, we will leave a message.  We will attempt to reach you two times.  During this call, we will ask if you have developed any symptoms of COVID 19. If you develop any symptoms (ie: fever, flu-like symptoms, shortness of breath, cough etc.) before then, please call [phone].  If you test positive for Covid 19 in the 2 weeks post procedure, please call and report this information to [place].    If any biopsies were taken you will be contacted by phone or by letter within the next 1-3 weeks.  Please call us at [phone] if you have not heard about the biopsies in 3 weeks.    SIGNATURES/CONFIDENTIALITY: You and/or your care partner have signed paperwork which will be entered into your electronic medical record.  These signatures attest to the fact that that the information above on your After Visit Summary has been reviewed and is understood.  Full responsibility of the confidentiality of this discharge information lies with you and/or your care-partner.

## 2020-07-06 ENCOUNTER — Telehealth: Payer: Self-pay | Admitting: *Deleted

## 2020-07-06 NOTE — Telephone Encounter (Signed)
  Follow up Call-  Call back number 07/04/2020  Post procedure Call Back phone  # [phone]  Permission to leave phone message Yes  Some recent data might be hidden     Patient questions:  Do you have a fever, pain , or abdominal swelling? No. Pain Score  0 *  Have you tolerated food without any problems? Yes.    Have you been able to return to your normal activities? Yes.    Do you have any questions about your discharge instructions: Diet   No. Medications  No. Follow up visit  No.  Do you have questions or concerns about your Care? No.  Actions: * If pain score is 4 or above: No action needed, pain <4.  1. Have you developed a fever since your procedure? no  2.   Have you had an respiratory symptoms (SOB or cough) since your procedure? no  3.   Have you tested positive for COVID 19 since your procedure no  4.   Have you had any family members/close contacts diagnosed with the COVID 19 since your procedure? no   If yes to any of these questions please route to Joylene John, RN and Joella Prince, RN

## 2020-07-09 ENCOUNTER — Encounter: Payer: Self-pay | Admitting: Internal Medicine

## 2020-07-10 DIAGNOSIS — M545 Low back pain, unspecified: Secondary | ICD-10-CM | POA: Diagnosis not present

## 2020-07-10 DIAGNOSIS — M419 Scoliosis, unspecified: Secondary | ICD-10-CM | POA: Diagnosis not present

## 2020-07-10 DIAGNOSIS — M5416 Radiculopathy, lumbar region: Secondary | ICD-10-CM | POA: Diagnosis not present

## 2020-07-20 DIAGNOSIS — M5416 Radiculopathy, lumbar region: Secondary | ICD-10-CM | POA: Diagnosis not present

## 2020-10-10 DIAGNOSIS — H6123 Impacted cerumen, bilateral: Secondary | ICD-10-CM | POA: Diagnosis not present

## 2020-10-10 DIAGNOSIS — H9192 Unspecified hearing loss, left ear: Secondary | ICD-10-CM | POA: Diagnosis not present

## 2020-10-10 DIAGNOSIS — H6982 Other specified disorders of Eustachian tube, left ear: Secondary | ICD-10-CM | POA: Diagnosis not present

## 2020-10-16 ENCOUNTER — Other Ambulatory Visit: Payer: Self-pay | Admitting: Cardiology

## 2020-10-16 ENCOUNTER — Telehealth: Payer: Self-pay | Admitting: Cardiology

## 2020-10-16 NOTE — Telephone Encounter (Signed)
*  STAT* If patient is at the pharmacy, call can be transferred to refill team.   1. Which medications need to be refilled? (please list name of each medication and dose if known)  amiodarone (PACERONE) 200 MG tablet  2. Which pharmacy/location (including street and city if local pharmacy) is medication to be sent to? [id number] - [place], [place] - [address]  3. Do they need a 30 day or 90 day supply? 90 with refills   Patient is out of medication

## 2020-11-17 DIAGNOSIS — H748X2 Other specified disorders of left middle ear and mastoid: Secondary | ICD-10-CM | POA: Diagnosis not present

## 2020-11-17 DIAGNOSIS — H6982 Other specified disorders of Eustachian tube, left ear: Secondary | ICD-10-CM | POA: Diagnosis not present

## 2020-11-17 DIAGNOSIS — Z7901 Long term (current) use of anticoagulants: Secondary | ICD-10-CM | POA: Diagnosis not present

## 2020-11-20 DIAGNOSIS — R3121 Asymptomatic microscopic hematuria: Secondary | ICD-10-CM | POA: Diagnosis not present

## 2020-11-20 DIAGNOSIS — N5201 Erectile dysfunction due to arterial insufficiency: Secondary | ICD-10-CM | POA: Diagnosis not present

## 2020-11-20 DIAGNOSIS — Z8546 Personal history of malignant neoplasm of prostate: Secondary | ICD-10-CM | POA: Diagnosis not present

## 2020-11-23 ENCOUNTER — Other Ambulatory Visit: Payer: Self-pay

## 2020-11-23 MED ORDER — AMIODARONE HCL 200 MG PO TABS: 200.0000 mg | ORAL_TABLET | Freq: Every day | ORAL | 0 refills | Status: DC

## 2020-11-23 NOTE — Progress Notes (Signed)
Called patient, he just states that he needs Amiodarone for an emergency supply he states that he ran out early and not sure why. He has an upcoming appointment on 08/24. I did send it to the pharmacy for 30 day supply.  Patient verbalized understanding.

## 2020-12-07 ENCOUNTER — Other Ambulatory Visit: Payer: Self-pay | Admitting: Cardiology

## 2020-12-07 DIAGNOSIS — I4819 Other persistent atrial fibrillation: Secondary | ICD-10-CM

## 2020-12-07 DIAGNOSIS — I428 Other cardiomyopathies: Secondary | ICD-10-CM

## 2020-12-07 NOTE — Telephone Encounter (Signed)
Prescription refill request for Eliquis received. Last office visit:c[place]shaw 06/20/20 Scr:1.5 05/05/20 Age: 24mWeight:98.9kg

## 2020-12-14 ENCOUNTER — Other Ambulatory Visit: Payer: Self-pay | Admitting: Cardiology

## 2020-12-19 NOTE — Progress Notes (Signed)
Cardiology Clinic Note   Patient Name: Patrick [name] Date of Encounter: 12/20/2020  Primary Care Provider:  Velna Hatchet, MD Primary Cardiologist:  Kirk Ruths, MD  Patient Profile    Patrick [name] presents to the clinic today for follow-up evaluation of his nonischemic cardiomyopathy and paroxysmal atrial fibrillation.  Past Medical History    Past Medical History:  Diagnosis Date   Allergy    Atrial tachycardia ([place])    Blood transfusion 1944   Cataract    Diverticulosis    Heart murmur    Hemorrhoids    Hyperlipemia    Prostate cancer ([place])    Gleason 6 status post radiation seed implant September 2010   Rheumatic heart disease    80yr old   Past Surgical History:  Procedure Laterality Date   CARDIAC ELECTROPHYSIOLOGY STUDY AND ABLATION  2011   for SVT   CARDIOVERSION N/A 05/18/2018   Procedure: CARDIOVERSION;  Surgeon: CBuford Dresser MD;  [facility]  Service: Cardiovascular;  Laterality: N/A;   CATARACT EXTRACTION W/ INTRAOCULAR LENS IMPLANT  2009   right   INGUINAL HERNIA REPAIR  01/2006   right   MELANOMA EXCISION  2011   superficial resection left maxillary area/ Dr GTonia Brooms  TEE WITHOUT CARDIOVERSION N/A 05/18/2018   Procedure: TRANSESOPHAGEAL ECHOCARDIOGRAM (TEE);  Surgeon: CBuford Dresser MD;  [place] [facility] [facility]  Service: Cardiovascular;  Laterality: N/A;   TONSILLECTOMY AND ADENOIDECTOMY  1945   TOTAL KNEE ARTHROPLASTY  1/11   s/p left   WRIST FUSION  2006   04/2004    Allergies  Allergies  Allergen Reactions   Asa Arthritis Strength-Antacid [Aspirin Buffered] Swelling    angioedema   Penicillins Rash    DID THE REACTION INVOLVE: Swelling of the face/tongue/throat, SOB, or low BP? No Sudden or severe rash/hives, skin peeling, or the inside of the mouth or nose? No Did it require medical treatment? No When did it last happen?   50+ years ago    If all above answers are "NO", may proceed with cephalosporin  use.     History of Present Illness    Patrick GLAZENERhas a PMH of paroxysmal atrial fibrillation, nonischemic cardiomyopathy, essential hypertension, atrial tachycardia, hyperlipidemia, and HTN.  He underwent ablation for atrial tachycardia in 2011.  He had complaints of palpitations 12/19.  He was found to be in atrial tachycardia.  He was placed on beta blocking medication.  He was felt to be at increased risk for repeat ablation due to his rheumatic fever.  An echocardiogram at that time showed reduced LVEF 20-25%, mild left ventricular hypertrophy, mild mitral regurgitation.  His heart rate during the study was 120-160.  He underwent TEE 1/20 which showed an EF of 20-25, mild mitral regurgitation, no left atrial appendage thrombus.  He underwent DCCV.  Dr. ARayann Hemanplan to admit afterwards and begin Tikosyn but patient declined due to his wife having cardiac arrest on Tikosyn.  He was found to be in atrial fibrillation during his follow-up visit.  Amiodarone was started and he converted to sinus rhythm.  Echocardiogram 6/20 showed normal LV function.  He was noted to have mild left atrial enlargement and trace aortic insufficiency.  He was seen by Dr. CStanford Breed2/22/2022.  During that time he denied dyspnea, chest discomfort, palpitations, and syncope.  He presents the clinic today for follow-up evaluation states he feels well.  He continues to be very physically active doing his yard work.  He is not  golfing as much as he previously did due to some lumbar spinal stenosis and sciatica that is affecting his right leg.  He reports that he has been seen by Dr. Nelva Bush and received a cortisone injection which has significantly improved his symptoms.  He has also noticed occasional heart rates in the 50s while resting at home in the evenings.  He feels well and reports these episodes have been brief.  I will order a CMP, CBC, TSH and chest x-ray due to his amiodarone therapy.  We will have him follow-up in 6  months.  He reports that he is in the process of moving to [facility].  I have asked him to let us know when he is established with his cardiologist so we may send his records for review.  Today he denies chest pain, shortness of breath, lower extremity edema, fatigue, palpitations, melena, hematuria, hemoptysis, diaphoresis, weakness, presyncope, syncope, orthopnea, and PND.     Home Medications    Prior to Admission medications   Medication Sig Start Date End Date Taking? Authorizing Provider  acetaminophen (TYLENOL) 500 MG tablet Take 500 mg by mouth every 6 (six) hours as needed (shoulder pain).     [provider]  amiodarone (PACERONE) 200 MG tablet Take 1 tablet (200 mg total) by mouth daily. 11/23/20   Lelon Perla, MD  ELIQUIS 5 MG TABS tablet TAKE 1 TABLET BY MOUTH TWICE A DAY 12/07/20   Lelon Perla, MD  losartan (COZAAR) 25 MG tablet Take 25 mg by mouth daily.    [provider]  Propylene Glycol 0.6 % SOLN Apply 1 drop to eye at bedtime.    [provider]    Family History    Family History  Problem Relation Age of Onset   Arthritis Other    Cancer Other        Breast cancer 1st degree relative<50   Breast cancer Mother    Breast cancer Maternal Grandmother    Colon cancer Neg Hx    Esophageal cancer Neg Hx    Pancreatic cancer Neg Hx    Rectal cancer Neg Hx    Stomach cancer Neg Hx    He indicated that his mother is deceased. He indicated that his father is deceased. He indicated that his maternal grandmother is deceased. He indicated that his maternal grandfather is deceased. He indicated that his paternal grandmother is deceased. He indicated that his paternal grandfather is deceased. He indicated that the status of his neg hx is unknown. He indicated that the status of his other is unknown.  Social History    Social History   Socioeconomic History   Marital status: Widowed    Spouse name: Not on file   Number of  children: Not on file   Years of education: Not on file   Highest education level: Not on file  Occupational History   Occupation: Retired    Fish farm manager: [employer]  Tobacco Use   Smoking status: Never   Smokeless tobacco: Never  Substance and Sexual Activity   Alcohol use: No   Drug use: No   Sexual activity: Not Currently  Other Topics Concern   Not on file  Social History Narrative   Regular exercise-Yes   Married   Lives in [place]   Social Determinants of Health   Financial Resource Strain: Not on file  Food Insecurity: Not on file  Transportation Needs: Not on file  Physical Activity: Not on file  Stress: Not  on file  Social Connections: Not on file  Intimate Partner Violence: Not on file     Review of Systems    General:  No chills, fever, night sweats or weight changes.  Cardiovascular:  No chest pain, dyspnea on exertion, edema, orthopnea, palpitations, paroxysmal nocturnal dyspnea. Dermatological: No rash, lesions/masses Respiratory: No cough, dyspnea Urologic: No hematuria, dysuria Abdominal:   No nausea, vomiting, diarrhea, bright red blood per rectum, melena, or hematemesis Neurologic:  No visual changes, wkns, changes in mental status. All other systems reviewed and are otherwise negative except as noted above.  Physical Exam    VS:  BP 132/78   Pulse 74   Resp 20   Ht '6\' 1"'$  (1.854 m)   Wt 209 lb (94.8 kg)   SpO2 95%   BMI 27.57 kg/m  , BMI Body mass index is 27.57 kg/m. GEN: Well nourished, well developed, in no acute distress. HEENT: normal. Neck: Supple, no JVD, carotid bruits, or masses. Cardiac: RRR, no murmurs, rubs, or gallops. No clubbing, cyanosis, edema.  Radials/DP/PT 2+ and equal bilaterally.  Respiratory:  Respirations regular and unlabored, clear to auscultation bilaterally. GI: Soft, nontender, nondistended, BS + x 4. MS: no deformity or atrophy. Skin: warm and dry, no rash. Neuro:  Strength and sensation are  intact. Psych: Normal affect.  Accessory Clinical Findings    Recent Labs: No results found for requested labs within last 8760 hours.   Recent Lipid Panel    Component Value Date/Time   CHOL 185 11/09/2012 0937   TRIG 176.0 (H) 11/09/2012 0937   HDL 38.80 (L) 11/09/2012 0937   CHOLHDL 5 11/09/2012 0937   VLDL 35.2 11/09/2012 0937   LDLCALC 111 (H) 11/09/2012 0937   LDLDIRECT 128.8 10/11/2009 0920    ECG personally reviewed by me today-none today.  Echocardiogram 10/05/2018  IMPRESSIONS     1. The left ventricle has normal systolic function, with an ejection  fraction of 55-60%. The cavity size was normal. mild basal septal  hypertrophy. Left ventricular diastolic parameters were normal. No  evidence of left ventricular regional wall motion  abnormalities.   2. The right ventricle has normal systolic function. The cavity was  normal. There is no increase in right ventricular wall thickness.   3. Left atrial size was mildly dilated.   4. The aortic valve is tricuspid. Mild sclerosis of the aortic valve.  Aortic valve regurgitation is trivial by color flow Doppler. 5. Compared  to prior echo, LVF has improved.   Assessment & Plan   1.  Paroxysmal atrial fibrillation-heart rate [id number].  Denies recent episodes of increased heart rate or irregular heartbeats. Continue amiodarone, apixaban Heart healthy low-sodium diet-salty 6 given Increase physical activity as tolerated Order CMP, CBC, TSH, CXR  Nonischemic cardiomyopathy-no increased DOE or activity intolerance.  Echocardiogram 10/05/2018 showed LVEF 55-60%, mild basilar septal hypertrophy, left atrial mild dilation. Continue losartan Heart healthy low-sodium diet-salty 6 given Increase physical activity as tolerated  Disposition: Follow-up with Dr. Stanford Breed in 6 months.   Jossie Ng. Jacquita Mulhearn NP-C    12/20/2020, 8:24 AM [address] Office [phone] Fax [phone]  Notice: This dictation was prepared with Dragon dictation along with smaller phrase technology. Any transcriptional errors that result from this process are unintentional and may not be corrected upon review.  I spent 14 minutes examining this patient, reviewing medications, and using patient centered shared decision making involving her cardiac care.  Prior to her visit  I spent greater than 20 minutes reviewing her past medical history,  medications, and prior cardiac tests.

## 2020-12-20 ENCOUNTER — Encounter: Payer: Self-pay | Admitting: General Practice

## 2020-12-20 ENCOUNTER — Ambulatory Visit (INDEPENDENT_AMBULATORY_CARE_PROVIDER_SITE_OTHER): Payer: Medicare Other | Admitting: General Practice

## 2020-12-20 ENCOUNTER — Ambulatory Visit
Admission: RE | Admit: 2020-12-20 | Discharge: 2020-12-20 | Disposition: A | Discharge status: Home / Self Care | Payer: Medicare Other | Source: Ambulatory Visit | Attending: General Practice | Admitting: General Practice

## 2020-12-20 ENCOUNTER — Other Ambulatory Visit: Payer: Self-pay

## 2020-12-20 VITALS — BP 132/78 | HR 74 | Resp 20 | Ht 73.0 in | Wt 209.0 lb

## 2020-12-20 DIAGNOSIS — I480 Paroxysmal atrial fibrillation: Secondary | ICD-10-CM

## 2020-12-20 DIAGNOSIS — Z79899 Other long term (current) drug therapy: Secondary | ICD-10-CM

## 2020-12-20 DIAGNOSIS — I700 Atherosclerosis of aorta: Secondary | ICD-10-CM | POA: Diagnosis not present

## 2020-12-20 DIAGNOSIS — I428 Other cardiomyopathies: Secondary | ICD-10-CM

## 2020-12-20 LAB — CBC
Hematocrit: 41.4 % (ref 37.5–51.0)
Hemoglobin: 13.8 g/dL (ref 13.0–17.7)
MCH: 34.7 pg — ABNORMAL HIGH (ref 26.6–33.0)
MCHC: 33.3 g/dL (ref 31.5–35.7)
MCV: 104 fL — ABNORMAL HIGH (ref 79–97)
Platelets: 195 10*3/uL (ref 150–450)
RBC: 3.98 x10E6/uL — ABNORMAL LOW (ref 4.14–5.80)
RDW: 13 % (ref 11.6–15.4)
WBC: 4.1 10*3/uL (ref 3.4–10.8)

## 2020-12-20 LAB — COMPREHENSIVE METABOLIC PANEL
ALT: 16 IU/L (ref 0–44)
AST: 25 IU/L (ref 0–40)
Albumin/Globulin Ratio: 1.6 (ref 1.2–2.2)
Albumin: 4.5 g/dL (ref 3.7–4.7)
Alkaline Phosphatase: 84 IU/L (ref 44–121)
BUN/Creatinine Ratio: 12 (ref 10–24)
BUN: 17 mg/dL (ref 8–27)
Bilirubin Total: 0.7 mg/dL (ref 0.0–1.2)
CO2: 24 mmol/L (ref 20–29)
Calcium: 9.5 mg/dL (ref 8.6–10.2)
Chloride: 102 mmol/L (ref 96–106)
Creatinine, Ser: 1.46 mg/dL — ABNORMAL HIGH (ref 0.76–1.27)
Globulin, Total: 2.8 g/dL (ref 1.5–4.5)
Glucose: 111 mg/dL — ABNORMAL HIGH (ref 65–99)
Potassium: 5.2 mmol/L (ref 3.5–5.2)
Sodium: 141 mmol/L (ref 134–144)
Total Protein: 7.3 g/dL (ref 6.0–8.5)
eGFR: 49 mL/min/{1.73_m2} — ABNORMAL LOW (ref >59)

## 2020-12-20 LAB — TSH: TSH: 3.75 u[IU]/mL (ref 0.450–4.500)

## 2020-12-20 NOTE — Patient Instructions (Signed)
Medication Instructions:  The current medical regimen is effective;  continue present plan and medications as directed. Please refer to the Current Medication list given to you today.  *If you need a refill on your cardiac medications before your next appointment, please call your pharmacy*  Lab Work: CMET, CBC AND TSH TODAY If you have labs (blood work) drawn today and your tests are completely normal, you will receive your results only by:  [place] (if you have MyChart) OR A paper copy in the mail.  If you have any lab test that is abnormal or we need to change your treatment, we will call you to review the results. You may go to any [facility] that is convenient for you however, we do have a lab in our office that is able to assist you. You DO NOT need an appointment for our lab. The lab is open 8:00am and closes at 4:00pm. Lunch 12:45 - 1:45pm.  Special Instructions Chest xray - Your physician has requested that you have a chest xray, is a fast and painless imaging test that uses certain electromagnetic waves to create pictures of the structures in and around your chest. This test can help diagnose and monitor conditions such as pneumonia and other lung issues his will be done at [address], [place]. If you should need to call them their phone number is [phone].  PLEASE READ AND FOLLOW SALTY 6-ATTACHED-1,800 mg daily  Follow-Up: Your next appointment:  6 month(s) In Person with Kirk Ruths, MD   At [facility], you and your health needs are our priority.  As part of our continuing mission to provide you with exceptional heart care, we have created designated Provider Care Teams.  These Care Teams include your primary Cardiologist (physician) and Advanced Practice Providers (APPs -  Physician Assistants and Nurse Practitioners) who all work together to provide you with the care you need, when you need it.          \

## 2020-12-21 NOTE — Telephone Encounter (Signed)
Please contact Patrick [name] and let him know that his aortic atherosclerosis was also present on his previous chest CT scan 05/25/2018.  We do not have any recent cholesterol results.  Please ask him if his cholesterol is followed by his PCP.  I would recommend a more recent fasting lipid panel for follow-up evaluation.   Thank you,   Jossie Ng. Cleaver NP-C   12/21/2020, 12:36 PM  [facility]  [address]  Office [phone] Fax [phone]

## 2020-12-28 ENCOUNTER — Other Ambulatory Visit: Payer: Self-pay | Admitting: Cardiology

## 2021-02-14 DIAGNOSIS — Z23 Encounter for immunization: Secondary | ICD-10-CM | POA: Diagnosis not present

## 2021-03-28 DIAGNOSIS — L578 Other skin changes due to chronic exposure to nonionizing radiation: Secondary | ICD-10-CM | POA: Diagnosis not present

## 2021-03-28 DIAGNOSIS — L821 Other seborrheic keratosis: Secondary | ICD-10-CM | POA: Diagnosis not present

## 2021-03-28 DIAGNOSIS — L570 Actinic keratosis: Secondary | ICD-10-CM | POA: Diagnosis not present

## 2021-03-28 DIAGNOSIS — D1801 Hemangioma of skin and subcutaneous tissue: Secondary | ICD-10-CM | POA: Diagnosis not present

## 2021-04-12 DIAGNOSIS — Z23 Encounter for immunization: Secondary | ICD-10-CM | POA: Diagnosis not present

## 2021-04-18 DIAGNOSIS — Z20822 Contact with and (suspected) exposure to covid-19: Secondary | ICD-10-CM | POA: Diagnosis not present

## 2021-04-26 DIAGNOSIS — S01112A Laceration without foreign body of left eyelid and periocular area, initial encounter: Secondary | ICD-10-CM | POA: Diagnosis not present

## 2021-04-26 DIAGNOSIS — W19XXXA Unspecified fall, initial encounter: Secondary | ICD-10-CM | POA: Diagnosis not present

## 2021-04-26 DIAGNOSIS — Z79899 Other long term (current) drug therapy: Secondary | ICD-10-CM | POA: Diagnosis not present

## 2021-04-26 DIAGNOSIS — S80212A Abrasion, left knee, initial encounter: Secondary | ICD-10-CM | POA: Diagnosis not present

## 2021-04-26 DIAGNOSIS — S098XXA Other specified injuries of head, initial encounter: Secondary | ICD-10-CM | POA: Diagnosis not present

## 2021-04-26 DIAGNOSIS — S00212A Abrasion of left eyelid and periocular area, initial encounter: Secondary | ICD-10-CM | POA: Diagnosis not present

## 2021-04-26 DIAGNOSIS — Z7901 Long term (current) use of anticoagulants: Secondary | ICD-10-CM | POA: Diagnosis not present

## 2021-04-26 DIAGNOSIS — G319 Degenerative disease of nervous system, unspecified: Secondary | ICD-10-CM | POA: Diagnosis not present

## 2021-04-26 DIAGNOSIS — R220 Localized swelling, mass and lump, head: Secondary | ICD-10-CM | POA: Diagnosis not present

## 2021-04-26 DIAGNOSIS — S50811A Abrasion of right forearm, initial encounter: Secondary | ICD-10-CM | POA: Diagnosis not present

## 2021-05-17 NOTE — Progress Notes (Deleted)
HPI: FU CM and atrial fibrillation. Patient has had previous ablation of atrial tachycardia in 2011. Patient seen December 2019 with complaints of palpitations. He was found to be in atrial tachycardia.  Patient was placed on beta-blocker. He was felt to be at increased risk for repeat ablation given history of rheumatic fever. Echo was ordered and showed newly reduced LV function with ejection fraction 20 to 25%, mild left ventricular hypertrophy, mild mitral regurgitation. His heart rate during the study was 120-160. Had TEE on May 18, 2018.  Ejection fraction 20 to 25%, mild mitral regurgitation, no left atrial appendage thrombus.  Patient successfully cardioverted. Note Dr. [name] had planned to admit afterwards and begin Tikosyn but patient declined as his wife had a cardiac arrest on Tikosyn. Found to be in atrial fibrillation at follow-up office visit. Amiodarone initiated and patient converted to sinus rhythm. Echocardiogram repeated June 2020 and showed normal LV function. There was mild left atrial enlargement and trace aortic insufficiency. Since last seen   Current Outpatient Medications  Medication Sig Dispense Refill   acetaminophen (TYLENOL) 500 MG tablet Take 500 mg by mouth every 6 (six) hours as needed (shoulder pain).      amiodarone (PACERONE) 200 MG tablet Take 1 tablet (200 mg total) by mouth daily. 30 tablet 0   ELIQUIS 5 MG TABS tablet TAKE 1 TABLET BY MOUTH TWICE A DAY 180 tablet 1   fluticasone (FLONASE) 50 MCG/ACT nasal spray Place 2 sprays into both nostrils daily.     losartan (COZAAR) 25 MG tablet TAKE 2 TABLETS BY MOUTH EVERY DAY 180 tablet 3   Propylene Glycol 0.6 % SOLN Apply 1 drop to eye at bedtime.     No current facility-administered medications for this visit.     Past Medical History:  Diagnosis Date   Allergy    Atrial tachycardia ([place])    Blood transfusion 1944   Cataract    Diverticulosis    Heart murmur    Hemorrhoids    Hyperlipemia     Prostate cancer ([place])    Gleason 6 status post radiation seed implant September 2010   Rheumatic heart disease    81yrs old    Past Surgical History:  Procedure Laterality Date   CARDIAC ELECTROPHYSIOLOGY STUDY AND ABLATION  2011   for SVT   CARDIOVERSION N/A 05/18/2018   Procedure: CARDIOVERSION;  Surgeon: Buford Dresser, MD;  Location: [facility];  Service: Cardiovascular;  Laterality: N/A;   CATARACT EXTRACTION W/ INTRAOCULAR LENS IMPLANT  2009   right   INGUINAL HERNIA REPAIR  01/2006   right   MELANOMA EXCISION  2011   superficial resection left maxillary area/ Dr [name]   TEE WITHOUT CARDIOVERSION N/A 05/18/2018   Procedure: TRANSESOPHAGEAL ECHOCARDIOGRAM (TEE);  Surgeon: Buford Dresser, MD;  Location[facility];  Service: Cardiovascular;  Laterality: N/A;   [place]  1/11   s/p left   WRIST FUSION  2006   04/2004    Social History   Socioeconomic History   Marital status: Widowed    Spouse name: Not on file   Number of children: Not on file   Years of education: Not on file   Highest education level: Not on file  Occupational History   Occupation: Retired    Fish farm manager: [employer]  Tobacco Use   Smoking status: Never   Smokeless tobacco: Never  Substance and Sexual Activity   Alcohol use: No  Drug use: No   Sexual activity: Not Currently  Other Topics Concern   Not on file  Social History Narrative   Regular exercise-Yes   Married   Lives in [place]   Social Determinants of Health   Financial Resource Strain: Not on file  Food Insecurity: Not on file  Transportation Needs: Not on file  Physical Activity: Not on file  Stress: Not on file  Social Connections: Not on file  Intimate Partner Violence: Not on file    Family History  Problem Relation Age of Onset   Arthritis Other    Cancer Other        Breast cancer 1st degree relative<50   Breast cancer Mother     Breast cancer Maternal Grandmother    Colon cancer Neg Hx    Esophageal cancer Neg Hx    Pancreatic cancer Neg Hx    Rectal cancer Neg Hx    Stomach cancer Neg Hx     ROS: no fevers or chills, productive cough, hemoptysis, dysphasia, odynophagia, melena, hematochezia, dysuria, hematuria, rash, seizure activity, orthopnea, PND, pedal edema, claudication. Remaining systems are negative.  Physical Exam: Well-developed well-nourished in no acute distress.  Skin is warm and dry.  HEENT is normal.  Neck is supple.  Chest is clear to auscultation with normal expansion.  Cardiovascular exam is regular rate and rhythm.  Abdominal exam nontender or distended. No masses palpated. Extremities show no edema. neuro grossly intact  ECG- personally reviewed  A/P  1 paroxysmal atrial fibrillation-patient remains in sinus rhythm today.  We will continue amiodarone and apixaban.  2 hypertension-patient's blood pressure is controlled today.  Continue present medical regimen.  3 history of cardiomyopathy-this was felt to be tachycardia mediated.  His LV function has normalized on most recent echocardiogram.  4 history of atrial tachycardia-patient is status post ablation without documented recurrences.  Kirk Ruths, MD

## 2021-05-30 ENCOUNTER — Telehealth: Payer: Self-pay | Admitting: Cardiology

## 2021-05-30 ENCOUNTER — Ambulatory Visit: Payer: Medicare Other | Admitting: Cardiology

## 2021-05-30 DIAGNOSIS — I700 Atherosclerosis of aorta: Secondary | ICD-10-CM

## 2021-05-30 NOTE — Telephone Encounter (Signed)
Patient with diagnosis of afib on Eliquis for anticoagulation.    Procedure: ESI Date of procedure: 06/08/21  CHA2DS2-VASc Score = 5   This indicates a 7.2% annual risk of stroke. The patient's score is based upon: CHF History: 1 HTN History: 1 Diabetes History: 0 Stroke History: 0 Vascular Disease History: 1 Age Score: 2 Gender Score: 0   CrCl 61mL/min using adjusted body weight Platelet count 195K  Per office protocol, patient can hold Eliquis for 3 days prior to procedure.

## 2021-05-30 NOTE — Telephone Encounter (Signed)
° °  Pre-operative Risk Assessment    Patient Name: Patrick [name]  DOB: [birth year] MRN: [id number]      Request for Surgical Clearance    Procedure:   Epidural Steroid Injection   Date of Surgery:  Clearance 06/08/21                                 Surgeon:  Dr. Eyvonne Mechanic Surgeon's Group or Practice Name:  [facility] Phone number:  [phone] Fax number:  [phone]   Type of Clearance Requested:   - Pharmacy:  Hold Apixaban (Eliquis) 3 days   Type of Anesthesia:  None    Additional requests/questions:   no  Signed, Kamira J Martinique   05/30/2021, 8:32 AM

## 2021-05-30 NOTE — Telephone Encounter (Signed)
Clinical pharmacist to review Eliquis 

## 2021-05-31 NOTE — Telephone Encounter (Signed)
° °  Name: Patrick [name]  DOB: [birth year]  MRN: [id number]   Primary Cardiologist: Kirk Ruths, MD  Chart reviewed as part of pre-operative protocol coverage. Patient was contacted 05/31/2021 in reference to pre-operative risk assessment for pending surgery as outlined below.  Patrick [name] was last seen on 12/20/2020 by Coletta Memos NP.  Since that day, Patrick [name] has done well without exertional chest pain or worsening dyspnea.  Therefore, based on ACC/AHA guidelines, the patient would be at acceptable risk for the planned procedure without further cardiovascular testing.   Patient may hold Eliquis for 3 days prior to the procedure and restart as soon as possible afterward at the discretion of the surgeon.  The patient was advised that if he develops new symptoms prior to surgery to contact our office to arrange for a follow-up visit, and he verbalized understanding.  I will route this recommendation to the requesting party via Epic fax function and remove from pre-op pool. Please call with questions.  [place], [place] 05/31/2021, 11:58 AM

## 2021-05-31 NOTE — Telephone Encounter (Signed)
Patient was cleared for back injection.  He mentioned that he permanently moved to [facility] and is scheduled to establish with a new cardiologist locally, Dr. Conley Canal.  He wished to express his gratitude to Dr. Stanford Breed for his care over the years.  Will send to Dr. Stanford Breed as Juluis Rainier

## 2021-06-03 ENCOUNTER — Other Ambulatory Visit: Payer: Self-pay | Admitting: Cardiology

## 2021-06-03 DIAGNOSIS — I4819 Other persistent atrial fibrillation: Secondary | ICD-10-CM

## 2021-06-04 NOTE — Telephone Encounter (Signed)
Prescription refill request for Eliquis received. Indication:Afib Last office visit:8/22 Scr:1.4 Age: 81 Weight:94.8 kg  Prescription refilled

## 2021-08-02 ENCOUNTER — Encounter: Payer: Self-pay | Admitting: *Deleted

## 2021-08-02 ENCOUNTER — Other Ambulatory Visit: Payer: Self-pay | Admitting: Cardiology

## 2021-09-10 ENCOUNTER — Other Ambulatory Visit: Payer: Self-pay

## 2021-11-27 ENCOUNTER — Other Ambulatory Visit: Payer: Self-pay | Admitting: Cardiology

## 2021-11-27 DIAGNOSIS — I4819 Other persistent atrial fibrillation: Secondary | ICD-10-CM

## 2021-11-27 NOTE — Telephone Encounter (Signed)
Prescription refill request for Eliquis received. Indication:Afib Last office visit:6/23 Scr:1.4 Age: 81 Weight:94.8 kg  Prescription refilled

## 2022-02-25 ENCOUNTER — Other Ambulatory Visit: Payer: Self-pay | Admitting: Cardiology

## 2022-05-06 ENCOUNTER — Other Ambulatory Visit: Payer: Self-pay | Admitting: Cardiology

## 2022-05-06 DIAGNOSIS — I4819 Other persistent atrial fibrillation: Secondary | ICD-10-CM

## 2022-05-29 ENCOUNTER — Other Ambulatory Visit: Payer: Self-pay | Admitting: Cardiology
